# Patient Record
Sex: Male | Born: 1985 | Race: White | Hispanic: No | Marital: Single | Smoking: Current every day smoker
Health system: Southern US, Community
[De-identification: ages and names within clinical notes are randomized; demographics above are authoritative.]

## PROBLEM LIST (undated history)

## (undated) DIAGNOSIS — F419 Anxiety disorder, unspecified: Secondary | ICD-10-CM

## (undated) DIAGNOSIS — M549 Dorsalgia, unspecified: Secondary | ICD-10-CM

## (undated) DIAGNOSIS — R569 Unspecified convulsions: Secondary | ICD-10-CM

## (undated) DIAGNOSIS — M25511 Pain in right shoulder: Secondary | ICD-10-CM

## (undated) DIAGNOSIS — F329 Major depressive disorder, single episode, unspecified: Secondary | ICD-10-CM

## (undated) DIAGNOSIS — R7402 Elevation of levels of lactic acid dehydrogenase (LDH): Secondary | ICD-10-CM

## (undated) DIAGNOSIS — B1920 Unspecified viral hepatitis C without hepatic coma: Secondary | ICD-10-CM

## (undated) DIAGNOSIS — F32A Depression, unspecified: Secondary | ICD-10-CM

## (undated) DIAGNOSIS — G893 Neoplasm related pain (acute) (chronic): Secondary | ICD-10-CM

## (undated) DIAGNOSIS — R740 Nonspecific elevation of levels of transaminase and lactic acid dehydrogenase [LDH]: Principal | ICD-10-CM

## (undated) DIAGNOSIS — R7401 Elevation of levels of liver transaminase levels: Secondary | ICD-10-CM

## (undated) DIAGNOSIS — N289 Disorder of kidney and ureter, unspecified: Secondary | ICD-10-CM

## (undated) DIAGNOSIS — R519 Headache, unspecified: Secondary | ICD-10-CM

## (undated) DIAGNOSIS — R51 Headache: Secondary | ICD-10-CM

## (undated) HISTORY — PX: ROTATOR CUFF REPAIR: SHX139

## (undated) HISTORY — DX: Headache, unspecified: R51.9

## (undated) HISTORY — DX: Major depressive disorder, single episode, unspecified: F32.9

## (undated) HISTORY — DX: Headache: R51

## (undated) HISTORY — DX: Elevation of levels of liver transaminase levels: R74.01

## (undated) HISTORY — PX: WISDOM TOOTH EXTRACTION: SHX21

## (undated) HISTORY — DX: Nonspecific elevation of levels of transaminase and lactic acid dehydrogenase (ldh): R74.0

## (undated) HISTORY — DX: Depression, unspecified: F32.A

## (undated) HISTORY — DX: Elevation of levels of lactic acid dehydrogenase (LDH): R74.02

---
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
## 2003-10-19 ENCOUNTER — Ambulatory Visit (HOSPITAL_COMMUNITY): Admission: RE | Admit: 2003-10-19 | Discharge: 2003-10-19 | Payer: Self-pay | Admitting: Orthopedic Surgery

## 2003-12-27 ENCOUNTER — Emergency Department (HOSPITAL_COMMUNITY): Admission: EM | Admit: 2003-12-27 | Discharge: 2003-12-27 | Payer: Self-pay | Admitting: Emergency Medicine

## 2004-08-17 ENCOUNTER — Emergency Department (HOSPITAL_COMMUNITY): Admission: EM | Admit: 2004-08-17 | Discharge: 2004-08-17 | Payer: Self-pay | Admitting: Emergency Medicine

## 2005-10-05 ENCOUNTER — Emergency Department (HOSPITAL_COMMUNITY): Admission: EM | Admit: 2005-10-05 | Discharge: 2005-10-05 | Payer: Self-pay | Admitting: Emergency Medicine

## 2005-10-05 ENCOUNTER — Emergency Department (HOSPITAL_COMMUNITY): Admission: EM | Admit: 2005-10-05 | Discharge: 2005-10-05 | Payer: Self-pay | Admitting: *Deleted

## 2006-07-07 ENCOUNTER — Emergency Department (HOSPITAL_COMMUNITY): Admission: EM | Admit: 2006-07-07 | Discharge: 2006-07-07 | Payer: Self-pay | Admitting: Emergency Medicine

## 2006-11-20 ENCOUNTER — Emergency Department (HOSPITAL_COMMUNITY): Admission: EM | Admit: 2006-11-20 | Discharge: 2006-11-20 | Payer: Self-pay | Admitting: Emergency Medicine

## 2006-11-23 ENCOUNTER — Emergency Department (HOSPITAL_COMMUNITY): Admission: EM | Admit: 2006-11-23 | Discharge: 2006-11-23 | Payer: Self-pay | Admitting: Emergency Medicine

## 2006-12-01 ENCOUNTER — Emergency Department (HOSPITAL_COMMUNITY): Admission: EM | Admit: 2006-12-01 | Discharge: 2006-12-01 | Payer: Self-pay | Admitting: Emergency Medicine

## 2006-12-04 ENCOUNTER — Emergency Department (HOSPITAL_COMMUNITY): Admission: EM | Admit: 2006-12-04 | Discharge: 2006-12-04 | Payer: Self-pay | Admitting: Emergency Medicine

## 2007-01-29 ENCOUNTER — Emergency Department (HOSPITAL_COMMUNITY): Admission: EM | Admit: 2007-01-29 | Discharge: 2007-01-29 | Payer: Self-pay | Admitting: Internal Medicine

## 2007-05-30 ENCOUNTER — Emergency Department (HOSPITAL_COMMUNITY): Admission: EM | Admit: 2007-05-30 | Discharge: 2007-05-30 | Payer: Self-pay | Admitting: Emergency Medicine

## 2007-06-03 ENCOUNTER — Emergency Department (HOSPITAL_COMMUNITY): Admission: EM | Admit: 2007-06-03 | Discharge: 2007-06-03 | Payer: Self-pay | Admitting: Emergency Medicine

## 2007-07-16 ENCOUNTER — Emergency Department (HOSPITAL_COMMUNITY): Admission: EM | Admit: 2007-07-16 | Discharge: 2007-07-16 | Payer: Self-pay | Admitting: Emergency Medicine

## 2008-05-19 ENCOUNTER — Emergency Department (HOSPITAL_COMMUNITY): Admission: EM | Admit: 2008-05-19 | Discharge: 2008-05-19 | Payer: Self-pay | Admitting: Emergency Medicine

## 2008-06-05 ENCOUNTER — Emergency Department (HOSPITAL_COMMUNITY): Admission: EM | Admit: 2008-06-05 | Discharge: 2008-06-05 | Payer: Self-pay | Admitting: Emergency Medicine

## 2008-06-11 ENCOUNTER — Emergency Department (HOSPITAL_COMMUNITY): Admission: EM | Admit: 2008-06-11 | Discharge: 2008-06-11 | Payer: Self-pay | Admitting: Emergency Medicine

## 2008-06-13 ENCOUNTER — Emergency Department (HOSPITAL_COMMUNITY): Admission: EM | Admit: 2008-06-13 | Discharge: 2008-06-13 | Payer: Self-pay | Admitting: Emergency Medicine

## 2008-06-21 ENCOUNTER — Emergency Department (HOSPITAL_COMMUNITY): Admission: EM | Admit: 2008-06-21 | Discharge: 2008-06-21 | Payer: Self-pay | Admitting: Emergency Medicine

## 2009-01-29 DIAGNOSIS — G893 Neoplasm related pain (acute) (chronic): Secondary | ICD-10-CM

## 2009-01-29 HISTORY — DX: Neoplasm related pain (acute) (chronic): G89.3

## 2009-06-08 ENCOUNTER — Emergency Department (HOSPITAL_COMMUNITY): Admission: EM | Admit: 2009-06-08 | Discharge: 2009-06-08 | Payer: Self-pay | Admitting: Emergency Medicine

## 2009-07-16 ENCOUNTER — Emergency Department (HOSPITAL_COMMUNITY): Admission: EM | Admit: 2009-07-16 | Discharge: 2009-07-16 | Payer: Self-pay | Admitting: Emergency Medicine

## 2009-09-23 ENCOUNTER — Emergency Department (HOSPITAL_COMMUNITY): Admission: EM | Admit: 2009-09-23 | Discharge: 2009-09-23 | Payer: Self-pay | Admitting: Emergency Medicine

## 2009-10-06 ENCOUNTER — Emergency Department (HOSPITAL_COMMUNITY): Admission: EM | Admit: 2009-10-06 | Discharge: 2009-10-06 | Payer: Self-pay | Admitting: Emergency Medicine

## 2009-11-09 ENCOUNTER — Emergency Department (HOSPITAL_COMMUNITY): Admission: EM | Admit: 2009-11-09 | Discharge: 2009-11-09 | Payer: Self-pay | Admitting: Emergency Medicine

## 2009-11-17 ENCOUNTER — Emergency Department (HOSPITAL_COMMUNITY): Admission: EM | Admit: 2009-11-17 | Discharge: 2009-11-18 | Payer: Self-pay | Admitting: Emergency Medicine

## 2009-12-14 ENCOUNTER — Emergency Department (HOSPITAL_COMMUNITY): Admission: EM | Admit: 2009-12-14 | Discharge: 2009-12-14 | Payer: Self-pay | Admitting: Emergency Medicine

## 2009-12-18 ENCOUNTER — Emergency Department (HOSPITAL_COMMUNITY): Admission: EM | Admit: 2009-12-18 | Discharge: 2009-12-19 | Payer: Self-pay | Admitting: Emergency Medicine

## 2010-01-10 ENCOUNTER — Emergency Department (HOSPITAL_COMMUNITY)
Admission: EM | Admit: 2010-01-10 | Discharge: 2010-01-10 | Payer: Self-pay | Source: Home / Self Care | Admitting: Emergency Medicine

## 2010-02-19 ENCOUNTER — Encounter: Payer: Self-pay | Admitting: Orthopaedic Surgery

## 2010-02-22 LAB — URINALYSIS, ROUTINE W REFLEX MICROSCOPIC
Bilirubin Urine: NEGATIVE
Nitrite: NEGATIVE
Protein, ur: NEGATIVE mg/dL
Specific Gravity, Urine: 1.022 (ref 1.005–1.030)
Urobilinogen, UA: 0.2 mg/dL (ref 0.0–1.0)

## 2010-02-22 LAB — COMPREHENSIVE METABOLIC PANEL
ALT: 146 U/L — ABNORMAL HIGH (ref 0–53)
Calcium: 9.5 mg/dL (ref 8.4–10.5)
Creatinine, Ser: 1.1 mg/dL (ref 0.4–1.5)
GFR calc Af Amer: >60 mL/min (ref >60)
Glucose, Bld: 64 mg/dL — ABNORMAL LOW (ref 70–99)
Sodium: 139 mEq/L (ref 135–145)
Total Protein: 7.1 g/dL (ref 6.0–8.3)

## 2010-02-22 LAB — CBC
HCT: 44 % (ref 39.0–52.0)
Hemoglobin: 14.6 g/dL (ref 13.0–17.0)
WBC: 8.6 10*3/uL (ref 4.0–10.5)

## 2010-02-22 LAB — SURGICAL PCR SCREEN
MRSA, PCR: NEGATIVE
Staphylococcus aureus: NEGATIVE

## 2010-02-22 LAB — APTT: aPTT: 36 seconds (ref 24–37)

## 2010-02-22 LAB — PROTIME-INR: INR: 0.97 (ref 0.00–1.49)

## 2010-02-23 ENCOUNTER — Ambulatory Visit (HOSPITAL_COMMUNITY)
Admission: RE | Admit: 2010-02-23 | Discharge: 2010-02-23 | Payer: Self-pay | Source: Home / Self Care | Attending: Orthopedic Surgery | Admitting: Orthopedic Surgery

## 2010-02-23 LAB — HEPATIC FUNCTION PANEL
AST: 73 U/L — ABNORMAL HIGH (ref 0–37)
Albumin: 4.1 g/dL (ref 3.5–5.2)
Total Bilirubin: 1.2 mg/dL (ref 0.3–1.2)
Total Protein: 6.9 g/dL (ref 6.0–8.3)

## 2010-03-13 NOTE — Op Note (Signed)
NAME:  Lawrence [name], Lawrence [name]            ACCOUNT NO.:  [id number]  MEDICAL RECORD NO.:  [id number]          PATIENT TYPE:  AMB  LOCATION:  SDS                          FACILITY:  [facility]  PHYSICIAN:  Vania Rea. Babette Stum, M.D.  DATE OF BIRTH:  [birth year]  DATE OF PROCEDURE:  02/23/2010 DATE OF DISCHARGE:  02/23/2010                              OPERATIVE REPORT   PREOPERATIVE DIAGNOSIS:  Recurrent right shoulder multidirectional instability.  POSTOPERATIVE DIAGNOSIS:  Recurrent right shoulder multidirectional instability.  PROCEDURES: 1. Right shoulder examination under anesthesia. 2. Right shoulder glenohumeral joint diagnostic arthroscopy. 3. Anterior capsulolabral reconstruction (Bankart repair). 4. Right shoulder posterior capsulolabral reconstruction (reverse     Bankart repair).  SURGEON:  Vania Rea. Neya Creegan, MD  ASSISTANT:  Lucita Lora. Shuford, PA-C  ANESTHESIA:  General endotracheal as well as an interscalene block.  ESTIMATED BLOOD LOSS:  Minimal.  DRAINS:  None.  HISTORY:  Lawrence [name] is a 25 year old male who has developed recurrent right shoulder multidirectional instability after two previous surgeries.  He had an initial open procedure over 10 years ago and I had performed a stabilization arthroscopically back into 2005.  He did very well until the last year or so which time he has developed recurrence of shoulder instability, which now was significantly impacted in his quality of life and functional capabilities.  Preoperatively, we counseled Lawrence [name] on treatment options as well as risks versus benefits thereof.  Possible surgical complications were reviewed including the potential for bleeding, infection, neurovascular injury, persistent pain, loss of motion, recurrence of instability and possible need for additional surgery.  He understands and accepts and agrees with our planned procedure.  PROCEDURE IN DETAIL:  After undergoing routine preop evaluation, the patient  received prophylactic antibiotics and an interscalene block was established in the holding area by the Anesthesia Department.  Placed supine on the operating table, underwent smooth induction of general endotracheal anesthesia.  Turned to the left lateral decubitus position on a beanbag and appropriately padded and protected.  Right shoulder examination under anesthesia did reveal obvious multidirectional instability with a very primarily anterior component.  The right arm was then suspended at 70 degrees of abduction with 10 pounds of traction, right shoulder girdle region was sterilely prepped and draped in standard fashion.  A time-out was called.  The posterior portal was established in the glenohumeral joint and the anterior portal was established under direct visualization.  The glenohumeral articular surfaces were overall in excellent condition.  There was obvious capsular laxity with evidence for multidirectional instability.  There was some chondromalacia on the posterior aspect of the humeral head where the humeral head was obviously subluxed anteriorly.  The biceps anchor was stable.  Rotator cuff was carefully inspected and found to be intact.  At this point, we proceed with the reconstruction starting anteriorly.  A periosteal elevator was used to divide the anterior labrum and capsular attachments from 2 o'clock down to 6 o'clock and we did remove some previously placed suture material.  This was appropriately male mobilized and then we used a bur to abrade the anterior face of the glenoid from 3 o'clock down to 6 o'clock.  An  accessory anterior-inferior portal was established through the subscapularis.  I placed a series of three Arthrex 2.4 PEEK SutureTak suture anchors at the 5:30 for 2:30 positions.  These suture limbs were then shuttled through the adjacent aspect of the anterior band of the inferior glenohumeral ligament adjacent to the labral tissue allowing  a superior-ward shift as well as a plication of the capsular tissues. These were passed in a horizontal mattress pattern and then tied with sliding locking knot followed by multiple overhand throws and alternating posts.  This allowed excellent reduction of the redundant anterior capsular volume and reestablished into appropriate anterior capsulolabral "bump".  Once this was completed to our satisfaction, we then turned our attention posteriorly where once again we mobilized the capsule attachments from the 10 o'clock down to the 6 o'clock position and abraded the margin of the glenoid with barbed mobilized tissues appropriately, had to remove some retained suture material, but was ultimately had a sleep tissue that could be shift to superior-ward appropriately.  We established an accessory posterior-inferior portal. We placed two posterior suture anchors, the most inferior was 3.0 that had two suture limbs through it and were superior at the 9 o'clock position was the single limb to 2.4 anchor.  The suture limbs were then shuttled once again through the posterior-inferior capsular tissues and the posterior band of the antrum glenohumeral ligament allowing a closure of the redundant tissues, capsular plication and reconstruction of a sturdy capsulolabral "bumper" posteriorly.  These again were all passed in a horizontal mattress pattern and then tied with sliding locking knots followed by multiple overhand throws and alternating posts.  This allowed excellent closure of the redundant posterior capsular volume.  We actually incorporated the portals within the final suture inferiorly and superiorly allowing a complete posterior capsular closure.  Final inspection showed overall position, alignment and to be much to our satisfaction.  Suture limbs were all appropriately clipped. Fluid and instruments were removed.  Portals were closed with Monocryl and Steri-Strips.  The bulky dry dressing  was then taped about the right shoulder.  The right arm was placed in sling immobilizer.  The patient was then awakened, extubated, and taken to the recovery room in stable condition.     Vania Rea. Emmitt Matthews, M.D.     KMS/MEDQ  D:  02/23/2010  T:  02/24/2010  Job:  [id number]  Electronically Signed by Francena Hanly M.D. on 03/13/2010 12:04:28 PM

## 2010-04-11 LAB — DIFFERENTIAL
Basophils Absolute: 0 10*3/uL (ref 0.0–0.1)
Basophils Relative: 0 % (ref 0–1)
Eosinophils Absolute: 0.2 10*3/uL (ref 0.0–0.7)
Monocytes Absolute: 0.8 10*3/uL (ref 0.1–1.0)
Monocytes Relative: 7 % (ref 3–12)
Neutro Abs: 6.5 10*3/uL (ref 1.7–7.7)
Neutrophils Relative %: 61 % (ref 43–77)

## 2010-04-11 LAB — URINALYSIS, ROUTINE W REFLEX MICROSCOPIC
Glucose, UA: NEGATIVE mg/dL
Hgb urine dipstick: NEGATIVE
Specific Gravity, Urine: 1.033 — ABNORMAL HIGH (ref 1.005–1.030)
pH: 6.5 (ref 5.0–8.0)

## 2010-04-11 LAB — RAPID URINE DRUG SCREEN, HOSP PERFORMED
Amphetamines: NOT DETECTED
Barbiturates: NOT DETECTED
Benzodiazepines: POSITIVE — AB
Cocaine: POSITIVE — AB
Opiates: POSITIVE — AB

## 2010-04-11 LAB — BASIC METABOLIC PANEL
BUN: 17 mg/dL (ref 6–23)
CO2: 22 mEq/L (ref 19–32)
Calcium: 9.6 mg/dL (ref 8.4–10.5)
GFR calc non Af Amer: >60 mL/min (ref >60)
Glucose, Bld: 84 mg/dL (ref 70–99)

## 2010-04-11 LAB — CBC
Hemoglobin: 15.6 g/dL (ref 13.0–17.0)
MCH: 32.5 pg (ref 26.0–34.0)
MCHC: 34.5 g/dL (ref 30.0–36.0)
RDW: 14.4 % (ref 11.5–15.5)

## 2010-04-12 LAB — POCT I-STAT, CHEM 8
Creatinine, Ser: 0.8 mg/dL (ref 0.4–1.5)
Glucose, Bld: 147 mg/dL — ABNORMAL HIGH (ref 70–99)
HCT: 44 % (ref 39.0–52.0)
Hemoglobin: 15 g/dL (ref 13.0–17.0)
Potassium: 3.2 mEq/L — ABNORMAL LOW (ref 3.5–5.1)
Sodium: 140 mEq/L (ref 135–145)
TCO2: 22 mmol/L (ref 0–100)

## 2010-04-14 LAB — HEPATIC FUNCTION PANEL
AST: 92 U/L — AB (ref 14–40)
Alkaline Phosphatase: 67 U/L (ref 25–125)
Bilirubin, Total: 0.9 mg/dL

## 2010-04-16 LAB — URINALYSIS, ROUTINE W REFLEX MICROSCOPIC
Bilirubin Urine: NEGATIVE
Hgb urine dipstick: NEGATIVE
Nitrite: NEGATIVE
Protein, ur: NEGATIVE mg/dL
Urobilinogen, UA: 0.2 mg/dL (ref 0.0–1.0)

## 2010-04-16 LAB — CBC
HCT: 46.7 % (ref 39.0–52.0)
Hemoglobin: 15.7 g/dL (ref 13.0–17.0)
MCV: 93.3 fL (ref 78.0–100.0)
Platelets: 211 10*3/uL (ref 150–400)
RBC: 5 MIL/uL (ref 4.22–5.81)
WBC: 6.1 10*3/uL (ref 4.0–10.5)

## 2010-04-16 LAB — URINE CULTURE

## 2010-04-16 LAB — DIFFERENTIAL
Eosinophils Absolute: 0.2 10*3/uL (ref 0.0–0.7)
Eosinophils Relative: 3 % (ref 0–5)
Lymphocytes Relative: 32 % (ref 12–46)
Lymphs Abs: 1.9 10*3/uL (ref 0.7–4.0)
Monocytes Absolute: 0.7 10*3/uL (ref 0.1–1.0)
Monocytes Relative: 11 % (ref 3–12)

## 2010-04-16 LAB — POCT I-STAT, CHEM 8
BUN: 21 mg/dL (ref 6–23)
Creatinine, Ser: 0.9 mg/dL (ref 0.4–1.5)
Glucose, Bld: 107 mg/dL — ABNORMAL HIGH (ref 70–99)
Sodium: 140 mEq/L (ref 135–145)
TCO2: 25 mmol/L (ref 0–100)

## 2010-04-18 LAB — URINALYSIS, ROUTINE W REFLEX MICROSCOPIC
Bilirubin Urine: NEGATIVE
Glucose, UA: NEGATIVE mg/dL
Hgb urine dipstick: NEGATIVE
Protein, ur: NEGATIVE mg/dL

## 2010-05-01 LAB — ALT
ALT: 192 U/L — AB (ref 10–40)
Blood: 192

## 2010-05-01 LAB — PROTEIN, TOTAL: Blood: 6.9

## 2010-05-07 ENCOUNTER — Emergency Department (HOSPITAL_COMMUNITY)
Admission: EM | Admit: 2010-05-07 | Discharge: 2010-05-07 | Disposition: A | Discharge status: Home / Self Care | Payer: Self-pay | Attending: Emergency Medicine | Admitting: Emergency Medicine

## 2010-05-07 ENCOUNTER — Emergency Department (HOSPITAL_COMMUNITY): Payer: Self-pay

## 2010-05-07 DIAGNOSIS — X58XXXA Exposure to other specified factors, initial encounter: Secondary | ICD-10-CM | POA: Insufficient documentation

## 2010-05-07 DIAGNOSIS — Z9889 Other specified postprocedural states: Secondary | ICD-10-CM | POA: Insufficient documentation

## 2010-05-07 DIAGNOSIS — IMO0002 Reserved for concepts with insufficient information to code with codable children: Secondary | ICD-10-CM | POA: Insufficient documentation

## 2010-05-07 DIAGNOSIS — Y9289 Other specified places as the place of occurrence of the external cause: Secondary | ICD-10-CM | POA: Insufficient documentation

## 2010-05-07 DIAGNOSIS — M25519 Pain in unspecified shoulder: Secondary | ICD-10-CM | POA: Insufficient documentation

## 2010-05-09 LAB — STREP A DNA PROBE

## 2010-05-09 LAB — RAPID STREP SCREEN (MED CTR MEBANE ONLY): Streptococcus, Group A Screen (Direct): NEGATIVE

## 2010-05-12 ENCOUNTER — Ambulatory Visit (INDEPENDENT_AMBULATORY_CARE_PROVIDER_SITE_OTHER): Payer: Self-pay | Admitting: Internal Medicine

## 2010-05-12 ENCOUNTER — Encounter: Payer: Self-pay | Admitting: Internal Medicine

## 2010-05-12 VITALS — BP 130/80 | HR 84 | Ht 69.0 in | Wt 212.0 lb

## 2010-05-12 DIAGNOSIS — R109 Unspecified abdominal pain: Secondary | ICD-10-CM

## 2010-05-12 NOTE — Progress Notes (Signed)
Subjective:    Patient ID: Lawrence [name], male    DOB: Nov 18, 1985, 25 y.o.   MRN: [id number]  HPI Lawrence [name] is here to followup elevated hepatic enzymes. Preoperatively in January of this year he was found to have elevated hepatic enzymes.  He was having a "tightening" procedure on the right shoulder by Dr. Rennis Chris . He had rotator cuff surgery for the first time in 2001. Followup arthroscopic surgery right shoulder in 2004.   He has no past medical history of hepatitis A,B or C. Remotely he did use IV drugs. He drinks occasionally. He has had Hepatitis immunization @ school 2002.  He denies fever, chills, sweats, or weight loss.   Plan results were reviewed. His ALT has ranged from 146-192; AST was initially 78 but rose to 92.        Review of Systems He has had intermittent abdominal pain.   Location #1 : L mid abd &  # 2occasionally RLQ Onset: in January post op  Radiation: no; Dysphagia : occasionally, esp bread Severity: up to 2 on 10 scale  Quality: #1dull , deep pain & #2 dull Duration: 5-10 min Better with: eating, TUMS Worse with: pain meds ( Oxycodone)   Symptoms Nausea/Vomiting: no  Diarrhea: no  Constipation: yes, with narcotic  Melena/BRBPR: no  Hematemesis: no  Anorexia: yes, intermittently  Fever/Chills: no  Dysuria/ pyuria/hematuria: no  Rash: no  Wt loss: no  EtOH use: yes, 2 beers every every 2-4  Weeks or less  NSAIDs/ASA: yes, Naproxen  1 pill bid   STD risk/hx: no (tested 09/2009)  Past Surgeries: see above. No PMH of transfusions        Objective:   Physical Exam Gen.: Healthy and well-nourished in appearance. Alert, appropriate and cooperative throughout exam. Very open & forthright Head: Normocephalic without obvious abnormalities;no  Alopecia. Thin beard Eyes: No corneal or conjunctival inflammation noted. Pupils equal round reactive to light and accommodation. No icterus  Mouth: Oral mucosa and oropharynx reveal no lesions or exudates but  tonsils large  & oropharyngeal erythema. Teeth in good repair. Neck: No deformities, masses, or tenderness noted. Range of motion normal. Thyroid  W/o nodules or enlargement. Lungs: Normal respiratory effort; chest expands symmetrically. Lungs are clear to auscultation without rales, wheezes, or increased work of breathing. Heart: Normal rate and rhythm. Normal S1 and S2. No gallop, click, or rub.S4; no murmur. Abdomen: Bowel sounds normal; abdomen soft and nontender. No masses, organomegaly or hernias noted.  Musculoskeletal/extremities:  No clubbing, cyanosis, edema, or deformity noted. Range of motion   decreased @ R shoulder .Tone & strength  normal.Joints normal. Nail health  good. Vascular: Carotid, radial artery, dorsalis pedis and dorsalis posterior tibial pulses are full and equal. No bruits present. Neurologic: Alert and oriented x3. Deep tendon reflexes symmetrical and normal. Skin: Intact without suspicious lesions or rashes. No jaundice  Lymph: No cervical, axillary, or inguinal lymphadenopathy present.  Psych: Mood and affect are normal. Normally interactive    Very open  Assessment & Plan:  #1 elevated liver function test; serially these have risen  #2 abdominal pain. Actually he  describes 2 types of abdominal pain. The left-sided pain is suggested to be reflux in nature.  #3 remote past history of IV drug abuse. This is an INactive issue.  Plan: #1 acute hepatitis panel. The triggers for liver function derangement were discussed. These include excess alcohol, excess Tylenol, and excess vitamin A  #2 the triggers for reflux were also discussed. These include the aspirin family (nonsteroidals including naproxen) ,alcohol, peppermint, tobacco, and caffeine (coffee, tea, cola, and chocolate). A protein pump inhibitor daily would be recommended with avoidance of these triggers. He should not  eat within 2 hours of going to bed.

## 2010-05-12 NOTE — Patient Instructions (Signed)
Please review this note in detail. Consider the triggers which can cause your symptoms.  I would recommend a fasting liver panel after you've made these changes for approximately 6 weeks. Take the Prilosec OTC 30 minutes before breakfast.

## 2010-05-15 ENCOUNTER — Other Ambulatory Visit: Payer: Self-pay | Admitting: Internal Medicine

## 2010-05-15 DIAGNOSIS — R768 Other specified abnormal immunological findings in serum: Secondary | ICD-10-CM

## 2010-05-15 LAB — HEPATITIS PANEL, ACUTE
HCV Ab: REACTIVE — AB
Hep A IgM: NEGATIVE
Hep B C IgM: NEGATIVE

## 2010-05-26 ENCOUNTER — Telehealth: Payer: Self-pay | Admitting: Internal Medicine

## 2010-05-26 NOTE — Telephone Encounter (Signed)
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

## 2010-05-26 NOTE — Telephone Encounter (Signed)
Spoke w/ pt grandfather informed that Hop out of office and will not return to Monday. Also informed that we didn't have designated release to speak w/ them will have to recheck records again since they state it was signed.

## 2010-05-27 NOTE — Telephone Encounter (Signed)
He needed follow up quantitative  Hepatitis C  serologies ( see Appendum to report). Derm needs to know of + hepatitis C . I never received notice of calls last week

## 2010-05-29 NOTE — Telephone Encounter (Signed)
Spoke w/ pt grandmother lab suggest HCV Qualitative, PCR [id number] will have pt schedule labs this week to be done at [facility] but will give me a call to know exactly what day so orders can be added.

## 2010-05-30 ENCOUNTER — Ambulatory Visit: Payer: Self-pay | Admitting: Internal Medicine

## 2010-06-01 ENCOUNTER — Other Ambulatory Visit: Payer: Self-pay

## 2010-06-01 DIAGNOSIS — R894 Abnormal immunological findings in specimens from other organs, systems and tissues: Secondary | ICD-10-CM

## 2010-06-07 ENCOUNTER — Emergency Department (HOSPITAL_COMMUNITY)
Admission: EM | Admit: 2010-06-07 | Discharge: 2010-06-07 | Disposition: A | Discharge status: Home / Self Care | Payer: Self-pay | Attending: Emergency Medicine | Admitting: Emergency Medicine

## 2010-06-07 DIAGNOSIS — M545 Low back pain, unspecified: Secondary | ICD-10-CM | POA: Insufficient documentation

## 2010-06-07 DIAGNOSIS — M5430 Sciatica, unspecified side: Secondary | ICD-10-CM | POA: Insufficient documentation

## 2010-06-07 DIAGNOSIS — M79609 Pain in unspecified limb: Secondary | ICD-10-CM | POA: Insufficient documentation

## 2010-06-07 DIAGNOSIS — G8929 Other chronic pain: Secondary | ICD-10-CM | POA: Insufficient documentation

## 2010-06-16 NOTE — Op Note (Signed)
[place]  Patient:    Lawrence [name], Lawrence [name] Visit Number: [id number] MRN: [id number]          Service Type: DSU Location: [facility] Attending Physician:  Colbert Ewing Dictated by:   Loreta Ave, M.D. Proc. Date: 10/24/00 Admit Date:  10/24/2000                             Operative Report  PREOPERATIVE DIAGNOSIS:  Multidirectional instability, right shoulder with secondary rotator cuff tendinitis and subacromial impingement.  POSTOPERATIVE DIAGNOSIS:  Multidirectional instability, right shoulder with secondary rotator cuff tendinitis and subacromial impingement.  OPERATIVE PROCEDURE:  Right shoulder exam under anesthesia with diagnostic arthroscopy and subacromial evaluation.  Open anteroinferior capsular shift, Neer-type.  SURGEON:  Loreta Ave, M.D.  ASSISTANT:  Arlys John D. Petrarca, P.A.-C.  ANESTHESIA:  General.  ESTIMATED BLOOD LOSS:  Minimal.  SPECIMENS:  None.  CULTURES:  None.  COMPLICATIONS:  None.  DRESSING:  Self-compressive with shoulder immobilizer.  DESCRIPTION OF PROCEDURE:  The patient was brought to the operating room and placed on the operating table in the supine position.  After adequate anesthesia had been obtained, the right shoulder was examined. Multidirectional instability, most marked _______ anteroinferior.  Some degree of posterior instability as well.  Full motion otherwise.  Placed in a beach chair position, prepped and draped in the usual sterile fashion.  A posterior portal used to access the shoulder.  The shoulder inspected with intact capsular ligamentous structures, somewhat redundant, but with no Bankart lesion or Hill-Sachs lesion.  Articular cartilage, labrum, biceps tendon, rotator cuff intact.  Cannula redirected subacromially on the top of the _________.  No significant spurring at the acromial/os interface.  Some bursitis and inflammatory tissue debrided.  The cuff itself was  structurally intact.  Instruments and fluid removed.  The anterior incision and coracoid to the anterior axillary fold.  Deltopectoral interval opened and appropriate retractors put in place.  Subscapularis tendon was taken down and tagged with #2 Ethibond.  Anterior capsule was left somewhat thickened with portions of the subscapularis tendon.  There was interval tearing at the top of the subscapularis which was then repaired with Ethibond.  The capsule was then cut from the 12 oclock to 6 oclock position at the humeral attachment.  Cut from the humerus to the glenoid at the 3 oclock position.  Freed up to pick up the redundancy in the capsule.  The anterior leaflet was then brought all the way up to the 4 oclock position and sutured in place at the humeral attachment with multiple interrupted #2 Ethibond sutures.  Superior capsule was then brought down to the 6 oclock position and oversewed again with nonabsorbable suture.  The two leaflets where they overlapped were also reinforced with suture.  This brought the shoulder into a much more stable position, taking up a lot of the capsule redundancy and an effort was taken to bring as much of the posterior capsule around to the front to tie up the posterior aspect as well.  Still maintained good motion, but with marked improvement in stability. Care taken to protect the axillary nerve throughout.  The subscapularis tendon was then repaired anatomically with Ethibond.  The wound copiously irrigated. The shoulder examined with good stability and motion with marked improvement of the instability present preoperative.  I could access the subacromial space from the anterior incision with digital inspection, and again, no significant bony spurs  were palpated.  The deltopectoral interval allowed to close.  The wound closed with subcutaneous and subcuticular Vicryl.  Margins of the wound were injected with Marcaine. Posterior portal closed with  nylon and injected with Marcaine.  A sterile compressive dressing and shoulder immobilizer applied.  Anesthesia reversed and brought to the recovery room.  Tolerated surgery well and with no complications. Dictated by:   Loreta Ave, M.D. Attending Physician:  Colbert Ewing DD:  10/24/00 TD:  10/24/00 Job: [id number]

## 2010-06-22 ENCOUNTER — Other Ambulatory Visit (INDEPENDENT_AMBULATORY_CARE_PROVIDER_SITE_OTHER): Payer: Self-pay | Admitting: General Surgery

## 2010-06-22 DIAGNOSIS — M7918 Myalgia, other site: Secondary | ICD-10-CM

## 2010-06-28 ENCOUNTER — Ambulatory Visit (HOSPITAL_COMMUNITY)
Admission: RE | Admit: 2010-06-28 | Discharge: 2010-06-28 | Disposition: A | Discharge status: Home / Self Care | Payer: Self-pay | Source: Ambulatory Visit | Attending: General Surgery | Admitting: General Surgery

## 2010-06-28 ENCOUNTER — Other Ambulatory Visit (INDEPENDENT_AMBULATORY_CARE_PROVIDER_SITE_OTHER): Payer: Self-pay | Admitting: General Surgery

## 2010-06-28 DIAGNOSIS — M7918 Myalgia, other site: Secondary | ICD-10-CM

## 2010-06-28 DIAGNOSIS — M25859 Other specified joint disorders, unspecified hip: Secondary | ICD-10-CM | POA: Insufficient documentation

## 2010-06-28 DIAGNOSIS — R229 Localized swelling, mass and lump, unspecified: Secondary | ICD-10-CM | POA: Insufficient documentation

## 2010-06-28 DIAGNOSIS — M4120 Other idiopathic scoliosis, site unspecified: Secondary | ICD-10-CM | POA: Insufficient documentation

## 2010-06-29 ENCOUNTER — Telehealth: Payer: Self-pay

## 2010-06-29 NOTE — Telephone Encounter (Signed)
Spoke w/ pt grandmother says she received a call about returning for labs and wanted to know what was going to be done this time says labs are costing around $400-500 and can't keep coming in for labs. Informed grandmother that test perform wasn't correct test per Dr. Alwyn Ren. Says he went to [facility] to have test and was told that the wasn't sure if this was correct test and pt had to be stuck multiple times says if this was the case why didn't they call to check and be sure this could have prevented another bill from coming which was $500. Informed grandmother that our office doesn't have anything to do with billing since test isn't process by our lab.  Informed that he needs to return to lab for more testing which we have written down will bring son in to our office instead of [facility] for repeat labs but just wanted to know what they can do about bill from labs performed on 06/01/10.

## 2010-07-03 ENCOUNTER — Telehealth: Payer: Self-pay

## 2010-07-03 DIAGNOSIS — B1920 Unspecified viral hepatitis C without hepatic coma: Secondary | ICD-10-CM

## 2010-07-03 NOTE — Telephone Encounter (Signed)
Order placed for Hep C clinic

## 2010-07-04 ENCOUNTER — Telehealth: Payer: Self-pay | Admitting: Internal Medicine

## 2010-07-04 NOTE — Telephone Encounter (Signed)
Left msg on voicemail for pt to return call . 

## 2010-07-07 ENCOUNTER — Ambulatory Visit: Payer: Self-pay | Admitting: Physical Medicine & Rehabilitation

## 2010-07-07 NOTE — Telephone Encounter (Signed)
Left msg on voicemail for pt to return call . 

## 2010-07-14 ENCOUNTER — Ambulatory Visit: Payer: Self-pay | Admitting: Physical Medicine & Rehabilitation

## 2010-07-14 ENCOUNTER — Inpatient Hospital Stay (INDEPENDENT_AMBULATORY_CARE_PROVIDER_SITE_OTHER)
Admission: RE | Admit: 2010-07-14 | Discharge: 2010-07-14 | Disposition: A | Discharge status: Home / Self Care | Payer: Self-pay | Source: Ambulatory Visit | Attending: Emergency Medicine | Admitting: Emergency Medicine

## 2010-07-14 ENCOUNTER — Ambulatory Visit (INDEPENDENT_AMBULATORY_CARE_PROVIDER_SITE_OTHER): Payer: Self-pay

## 2010-07-14 DIAGNOSIS — IMO0002 Reserved for concepts with insufficient information to code with codable children: Secondary | ICD-10-CM

## 2010-08-05 ENCOUNTER — Emergency Department (HOSPITAL_COMMUNITY): Payer: Self-pay

## 2010-08-05 ENCOUNTER — Emergency Department (HOSPITAL_COMMUNITY)
Admission: EM | Admit: 2010-08-05 | Discharge: 2010-08-05 | Disposition: A | Discharge status: Home / Self Care | Payer: Self-pay | Attending: Emergency Medicine | Admitting: Emergency Medicine

## 2010-08-05 ENCOUNTER — Emergency Department (HOSPITAL_COMMUNITY)
Admission: EM | Admit: 2010-08-05 | Discharge: 2010-08-06 | Disposition: A | Discharge status: Home / Self Care | Payer: Self-pay | Attending: Emergency Medicine | Admitting: Emergency Medicine

## 2010-08-05 DIAGNOSIS — M549 Dorsalgia, unspecified: Secondary | ICD-10-CM | POA: Insufficient documentation

## 2010-08-05 DIAGNOSIS — F411 Generalized anxiety disorder: Secondary | ICD-10-CM | POA: Insufficient documentation

## 2010-08-05 DIAGNOSIS — M25519 Pain in unspecified shoulder: Secondary | ICD-10-CM | POA: Insufficient documentation

## 2010-08-05 DIAGNOSIS — T148XXA Other injury of unspecified body region, initial encounter: Secondary | ICD-10-CM | POA: Insufficient documentation

## 2010-08-05 DIAGNOSIS — Z87442 Personal history of urinary calculi: Secondary | ICD-10-CM | POA: Insufficient documentation

## 2010-08-05 DIAGNOSIS — W010XXA Fall on same level from slipping, tripping and stumbling without subsequent striking against object, initial encounter: Secondary | ICD-10-CM | POA: Insufficient documentation

## 2010-08-05 DIAGNOSIS — R079 Chest pain, unspecified: Secondary | ICD-10-CM | POA: Insufficient documentation

## 2010-08-05 DIAGNOSIS — X500XXA Overexertion from strenuous movement or load, initial encounter: Secondary | ICD-10-CM | POA: Insufficient documentation

## 2010-08-05 DIAGNOSIS — Y93E1 Activity, personal bathing and showering: Secondary | ICD-10-CM | POA: Insufficient documentation

## 2010-08-05 DIAGNOSIS — M546 Pain in thoracic spine: Secondary | ICD-10-CM | POA: Insufficient documentation

## 2010-08-05 DIAGNOSIS — Z79899 Other long term (current) drug therapy: Secondary | ICD-10-CM | POA: Insufficient documentation

## 2010-08-16 ENCOUNTER — Telehealth: Payer: Self-pay | Admitting: *Deleted

## 2010-08-16 ENCOUNTER — Other Ambulatory Visit: Payer: Self-pay | Admitting: Orthopaedic Surgery

## 2010-08-16 DIAGNOSIS — M545 Low back pain: Secondary | ICD-10-CM

## 2010-08-16 DIAGNOSIS — M79605 Pain in left leg: Secondary | ICD-10-CM

## 2010-08-16 NOTE — Telephone Encounter (Signed)
Pt family member called noting that pt was referred to Hep C clinic. She states that this was over one month ago and they have not heard anything. She notes that she has called several times and has received a return call. Will forward message to [facility] for assistance.

## 2010-08-18 NOTE — Telephone Encounter (Signed)
Noted. Thanks.

## 2010-08-18 NOTE — Telephone Encounter (Addendum)
I called the Hep Clinic on 08-17-10 to check status of referral, had to LVM for someone to call me back.  I did not receive a call back yet, so I have now called & left a 2nd message for status of referral.  I have informed Lawrence [name] of above, and will contact her as soon as I get return call from Clinic.

## 2010-08-21 NOTE — Telephone Encounter (Signed)
Per return call from [facility], they are very behind.  They do still have patient's referral, however it looks as though when they contact him to schedule it may be a September appt.  They have 1 doctor that comes from [facility], and sees patient's only once per week on Thursdays.  I informed Alphonzo Severance, she understands.

## 2010-08-26 ENCOUNTER — Other Ambulatory Visit: Payer: Self-pay

## 2010-08-28 ENCOUNTER — Ambulatory Visit: Payer: Self-pay | Admitting: Physical Medicine & Rehabilitation

## 2010-08-28 ENCOUNTER — Encounter: Payer: Self-pay | Attending: Physical Medicine & Rehabilitation

## 2010-08-28 ENCOUNTER — Ambulatory Visit (HOSPITAL_COMMUNITY)
Admission: RE | Admit: 2010-08-28 | Discharge: 2010-08-28 | Disposition: A | Discharge status: Home / Self Care | Payer: Self-pay | Source: Ambulatory Visit | Attending: Orthopaedic Surgery | Admitting: Orthopaedic Surgery

## 2010-08-28 DIAGNOSIS — M79609 Pain in unspecified limb: Secondary | ICD-10-CM | POA: Insufficient documentation

## 2010-08-28 DIAGNOSIS — M545 Low back pain, unspecified: Secondary | ICD-10-CM | POA: Insufficient documentation

## 2010-08-28 DIAGNOSIS — M519 Unspecified thoracic, thoracolumbar and lumbosacral intervertebral disc disorder: Secondary | ICD-10-CM | POA: Insufficient documentation

## 2010-08-28 DIAGNOSIS — M47817 Spondylosis without myelopathy or radiculopathy, lumbosacral region: Secondary | ICD-10-CM | POA: Insufficient documentation

## 2010-08-28 DIAGNOSIS — M79605 Pain in left leg: Secondary | ICD-10-CM

## 2010-08-28 DIAGNOSIS — M5430 Sciatica, unspecified side: Secondary | ICD-10-CM | POA: Insufficient documentation

## 2010-09-04 ENCOUNTER — Other Ambulatory Visit: Payer: Self-pay | Admitting: Orthopaedic Surgery

## 2010-09-04 DIAGNOSIS — M47816 Spondylosis without myelopathy or radiculopathy, lumbar region: Secondary | ICD-10-CM

## 2010-09-11 ENCOUNTER — Ambulatory Visit
Admission: RE | Admit: 2010-09-11 | Discharge: 2010-09-11 | Disposition: A | Discharge status: Home / Self Care | Payer: No Typology Code available for payment source | Source: Ambulatory Visit | Attending: Orthopaedic Surgery | Admitting: Orthopaedic Surgery

## 2010-09-11 DIAGNOSIS — M47816 Spondylosis without myelopathy or radiculopathy, lumbar region: Secondary | ICD-10-CM

## 2010-09-11 MED ORDER — METHYLPREDNISOLONE ACETATE 40 MG/ML INJ SUSP (RADIOLOG
120.0000 mg | Freq: Once | INTRAMUSCULAR | Status: AC
  Administered 2010-09-11: 120 mg via EPIDURAL

## 2010-09-11 MED ORDER — IOHEXOL 180 MG/ML  SOLN
1.0000 mL | Freq: Once | INTRAMUSCULAR | Status: AC | PRN
  Administered 2010-09-11: 1 mL via EPIDURAL

## 2010-09-28 ENCOUNTER — Ambulatory Visit (INDEPENDENT_AMBULATORY_CARE_PROVIDER_SITE_OTHER): Payer: No Typology Code available for payment source | Admitting: Gastroenterology

## 2010-09-28 VITALS — BP 147/99 | HR 86 | Temp 99.2°F | Ht 70.0 in | Wt 206.0 lb

## 2010-09-28 DIAGNOSIS — B182 Chronic viral hepatitis C: Secondary | ICD-10-CM

## 2010-09-29 LAB — CBC WITH DIFFERENTIAL/PLATELET
Lymphocytes Relative: 25 % (ref 12–46)
Lymphs Abs: 2.7 10*3/uL (ref 0.7–4.0)
MCV: 90.9 fL (ref 78.0–100.0)
Neutrophils Relative %: 66 % (ref 43–77)
Platelets: 364 10*3/uL (ref 150–400)
RBC: 4.94 MIL/uL (ref 4.22–5.81)
WBC: 11.1 10*3/uL — ABNORMAL HIGH (ref 4.0–10.5)

## 2010-09-29 LAB — COMPREHENSIVE METABOLIC PANEL
AST: 34 U/L (ref 0–37)
Albumin: 5 g/dL (ref 3.5–5.2)
Alkaline Phosphatase: 70 U/L (ref 39–117)
Potassium: 4 mEq/L (ref 3.5–5.3)
Sodium: 138 mEq/L (ref 135–145)
Total Protein: 7.9 g/dL (ref 6.0–8.3)

## 2010-09-29 LAB — TSH: TSH: 0.583 u[IU]/mL (ref 0.350–4.500)

## 2010-09-29 LAB — IRON AND TIBC
TIBC: 425 ug/dL (ref 215–435)
UIBC: 289 ug/dL (ref 125–400)

## 2010-09-29 LAB — AFP TUMOR MARKER: AFP-Tumor Marker: 3.4 ng/mL (ref 0.0–8.0)

## 2010-09-29 LAB — PROTIME-INR: INR: 0.97 (ref <1.50)

## 2010-09-29 LAB — ANA: Anti Nuclear Antibody(ANA): NEGATIVE

## 2010-10-12 ENCOUNTER — Ambulatory Visit (INDEPENDENT_AMBULATORY_CARE_PROVIDER_SITE_OTHER): Payer: No Typology Code available for payment source | Admitting: Gastroenterology

## 2010-10-12 DIAGNOSIS — B182 Chronic viral hepatitis C: Secondary | ICD-10-CM

## 2010-10-12 NOTE — Progress Notes (Signed)
NAME:  Lawrence [name], [name]  MR#:  [id number]      DATE:  09/28/2010  DOB:  May 18, 1985    cc: Consulting Physician: Lawrence [name] at [facility], [address] [place], [place] [address], Primary Care Physician:  Same. Referring Physician:  Marga Melnick, MD, [facility] [place], [address] [place], [place], [place] [address], Fax [phone]    REASON FOR REFERRAL:  Positive hepatitis C antibody and qualitative HCV RNA.   History:  The patient is a 25 year old man who I have been asked to see in consultation by Dr. Alwyn [name] regarding his positive hepatitis C antibody and positive HCV RNA qualitative HCV RNA.  According to the patient, there is no known history of liver disease until he presented to Lawrence [name], a local orthopedic surgeon in [place], for repair of a right rotator cuff injury. Liver tests  were done at that time were found to be abnormal. Eventually this was sent back to his primary physician who on 05/15/2010, found that he was hepatitis C antibody positive. This was confirmed with a positive  qualitative HCV RNA. Unfortunately, this was not quantitated nor was a genotype done. There are no symptoms referable to his history of hepatitis C nor are there symptoms to suggest cryoglobulin mediated or  decompensated liver disease.  With respect to risk factors for liver disease, he would binge drink approximately once a month, but he has not alcohol in the last 5-6 months. 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 He has not had any counselor relationship at this time. There is no history of blood transfusions, tattoos, or unsterile body piercing,  or family history of liver disease. He thinks he may have been vaccinated hepatitis B in the past.    Past medical history:  Unremarkable, specifically there is no thyroid disease, diabetes, dyslipidemia, hypertension, or coronary artery disease.   PAST SURGICAL HISTORY:  Three  repairs to his right rotator cuff last 1 being January 2012.    Past psychiatric history:  Anxiety disorder for last 9 years. There is no history of suicide attempt or hospitalizations for the same. He primarily has panic attacks when the anxiety level builds up. He sees Customer service manager a  local therapist, the office for which I cannot find, every 2 months, and he also sees another therapist in [place] every 2 weeks, the name of which she cannot recall.   CURRENT MEDICATIONS:  Xanax 1 mg p.o. t.i.d., Percocet for back pain q. 6 hours p.r.n.   ALLERGIES:  Tramadol causes fever with headaches.    Habits:  Smoking history, smokes 1 pack of cigarettes per day. Alcohol as above.   FAMILY HISTORY:  As above.   SOCIAL HISTORY:  Single with 1 child. Works at [employer]. He is accompanied by his grandmother today.   REVIEW OF SYSTEMS:  All 10 systems reviewed today with the patient on the review of systems form, which was signed and placed in the chart. CES-D was 30.     PHYSICAL EXAMINATION:  Constitutional:  No significant peripheral wasting. Vital signs: Height 70 inches, weight 206 pounds, blood pressure 147/99, pulse of 86, temperature 99.2 Fahrenheit. Ears, nose, mouth and throat:   Unremarkable oropharynx.  No thyromegaly or neck masses.  Chest:  Resonant to percussion.  Clear to auscultation.  Cardiovascular:  Heart sounds normal S1, S2 without murmurs or rubs.  There is no  peripheral edema.  Abdominal:  Normal bowel sounds.  No masses or tenderness.  I could not appreciate a liver edge or spleen tip.  I could not appreciate any hernias.  Lymphatics:  No cervical or  inguinal lymphadenopathy.  Central Nervous System:  No asterixis or focal neurologic findings.  Dermatologic:  Anicteric without palmar  erythema or spider angiomata.  Eyes:  Anicteric sclerae.  Pupils are equal and reactive to light.   laboratories:  On 05/12/2010, hepatitis C antibody was positive, hepatitis B  surface antibody was negative. ALT on 05/01/2010 was 192, AST on 04/14/2010, was 92, with an ALP of 67, total bilirubin 0.9 of which the direct was 0.2.   Assessment:  The patient is a 25 year old gentleman with history of genotype unknown hepatitis C who is naive to therapy and has not been biopsied. I have concerns about his candidacy for treatment because of his  history of anxiety disorder. He does not even know the name of his therapist that he started seeing so that I can stay in contact with this person to establish his fitness to tolerate therapy. There is  most likely no rush to treat him at this time, however.  In my discussion today with the patient and his grandmother who brought him today, we discussed the nature and natural history of hepatitis C. We discussed genotyping him. I discussed treatment of all  genotypes with pegylated interferon, ribavirin and genotype ones with protease inhibitors in addition to the interferon and ribavirin. I reviewed the specific systems, constitutional, psychiatric side  effects of therapy and the response rates. I discussed our protocol for clinic. We also discussed the risk of contagion. I have asked him  that when he sees his therapist next to discuss the possibility of treatment and its indications with them.   Plan:  1. Standard labs. 2. Test for hepatitis A and B immunity. 3. To arrange for a biopsy if genotype 1, to assess extent of disease before deciding on treatment and then follow up. 4. If nongenotype 1, then will not need a biopsy. Will come back to clinic discuss results directly. 5. Literature on hepatitis C given to the patient and his grandmother. 6. I told him he must stop smoking. 7. He is not a candidate for any protocols because of his anxiety disorder. 8. He is to follow up in 2 month's time. 9. He and his grandmother report that they would supply the name of the therapist that he sees in [place], so I can copy my notes to  this person.            Brooke Dare, MD   ADDENDUM Genotype 2b  After the visit on 10/09/10, the patient called back with the name of the therapist:   Tamera Punt at the  [facility],   [facility] [place] [facility] [address]  [phone] [place], [place] [address] Fax: [phone] Her opinion is critical in the determination of his candidacy for treatment. I can be reached by her at [phone] to discuss.  [phone] .[id number]  D:  Thu Aug 30 17:50:54 2012 ; T:  Thu Aug 30 21:03:12 2012  Job #:  [id number]

## 2010-10-19 ENCOUNTER — Encounter: Payer: Self-pay | Admitting: Gastroenterology

## 2010-10-19 NOTE — Progress Notes (Signed)
NAMEMarland [name]  [name], Lawrence    MR#:  [id number]      DATE:  10/12/2010  DOB:  1985/07/05    cc:     Referring physician:  Marga Melnick, MD [facility] [place] [facility] [address] [place], [place]   [address] Fax:  [phone]   Primary care physician:  The patient and his grandmother come in today reporting that he has no family physician.    Consulting physician:  Tamera Punt [facility]  [place] [facility] [address]  [place], [place] [address]  Fax: [phone]  Phone:  [phone]   REASON FOR VISIT:  Follow up of genotype 2B hepatitis C.   HISTORY:  The patient returns today accompanied by his grandmother. He returns to review the results of the lab testing from his visit on 09/28/2010. When I first saw him I was concerned about his anxiety disorder and  its impact on his fitness to tolerate therapy. At that time he could not even tell me the name of his therapist. His family had called in the name of the therapist, Tamera Punt, on 10/09/2010. Because I only  see patients 1 day a week, this is the first time I have seen the chart since their call and I have not had a chance to talk to Tamera Punt before the appointment.   PAST MEDICAL HISTORY:  There are no interval changes.   CURRENT MEDICATIONS:  1. Xanax 1 mg p.o. t.i.d. 2. Percocet for back pain q.6 hours p.r.n.  ALLERGIES:  Tylenol causes fever and headache.    HABITS:  Smoking:  1 pack of cigarettes per day. Alcohol: Denies interval consumption.   REVIEW OF SYSTEMS:  All 10 systems reviewed today with the patient and are negative other than that which is mentioned above. His CES-D 26.   PHYSICAL EXAMINATION:  Constitutional: Well appearing. Vital signs: Height 70 inches, weight 211 pounds, blood pressure 122/88, pulse 94, temperature 97.6.   LABORATORY STUDIES:  Previous laboratory work from 09/28/2010.  Genotype 2B.  His iron saturation  was 32%.  CBC was unremarkable with a platelet count of 364. AFP was 3.4.  ANA negative.  His AST was 34, ALT 63, ALP 70,  total bilirubin was 1.6, creatinine was 0.7, albumin was 5 with globulins of 2.9.  Ferritin was 55.  Total A antibody was negative.  Hepatitis B surface antigen was positive. INR was 0.97 and TSH was normal at 0.583.   ASSESSMENT:  The patient is a 25 year old gentleman with a history of genotype 2B hepatitis C, naive to therapy. He appears to be clinically well compensated. I think the bilirubin is possibly a laboratory error,  because his previous bilirubins have been much better and his synthetic function otherwise appears acceptable.  My continued concern is that of the impact of interferon on his anxiety disorder. I would like to speak to his therapist regarding this.    In my discussion today with the patient and his grandmother, who accompanied him, we discussed his lab results from his last clinic appointment. I have explained to him that for his genotype 2B he would  need 6 months of therapy with an 80% response with PEG-interferon and ribavirin. I once again reviewed the specific system, constitutional, and psychiatric side effects of therapy, with an emphasis on  psychiatric side effects because of his history of anxiety. He indicated to me that I could talk to his therapist regarding this. I have  explained that if I was to receive favorable reviews of his compliance and progress through therapy from his therapist, I could consider sending him the patient application  forms for assistance for the Pegasys and ribavirin, as he lacks insurance. The patient indicates that he is going to see his therapist  tomorrow for a regularly scheduled appointment. I asked him to discuss the issue of treatment with her at the time as well.   plan:  1. I will contact this therapist by phone today after his clinic appointment. 2. He will need to obtain hepatitis A vaccination.  In  keeping with the [facility] health policy, unfortunately I cannot do that here.  His grandmother indicated that they would try to contact Dr. Quintella Reichert to see if they could pay for the vaccine out of pocket. 3. He is hepatitis B immune.            Brooke Dare, MD    Addendum:   I have call [facility]. I attempted to leave a message for Tamera Punt to call me back. The receptionist would not accept my explanation that the patient has already given me  permission to speak to her and that the patient will see Ms Tyre tomorrow to discuss this as well. Nevertheless, I left a message and have not heard back from her as of yet. The onus is on both the  patient and Ms Michaelene Song to contact me before I can proceed with treatment for the patient.  Having not heard back within a week, I wrote a letter to the patient indicating that I have not heard back from his therapist after leaving a message and he needs to have the therapist contact me in order to move forward.     [phone] .[id number]  D:  Thu Sep 13 20:26:40 2012 ; T:  Sat Sep 15 14:07:13 2012  Job #:  [id number]

## 2010-10-20 ENCOUNTER — Telehealth: Payer: Self-pay | Admitting: Gastroenterology

## 2010-10-20 NOTE — Telephone Encounter (Signed)
Tamera Punt, Mr [facility] therapist, called and we discussed his candidacy for HCV therapy.  He is compliant with visits and is seen every 10 days.  She thinks his anxiety is controlled if he is maintained on his Xanax.  I told her that I would consider starting him on treatment for his hepatitis C and that she should call me on my work cell should there be any changes.  She will see him today.  I will need to send him the [facility] Access to Care application.

## 2010-10-26 ENCOUNTER — Encounter: Payer: Self-pay | Admitting: Gastroenterology

## 2010-11-03 LAB — CBC
HCT: 42.5
MCHC: 34.2
Platelets: 388
RDW: 14.7

## 2010-11-03 LAB — DIFFERENTIAL
Basophils Absolute: 0.1
Basophils Relative: 1
Eosinophils Absolute: 0.2
Eosinophils Relative: 2
Lymphocytes Relative: 22

## 2010-11-03 LAB — BASIC METABOLIC PANEL
BUN: 12
CO2: 27
GFR calc non Af Amer: >60
Glucose, Bld: 112 — ABNORMAL HIGH
Potassium: 3.9

## 2010-11-07 ENCOUNTER — Other Ambulatory Visit: Payer: Self-pay | Admitting: Orthopaedic Surgery

## 2010-11-07 DIAGNOSIS — M549 Dorsalgia, unspecified: Secondary | ICD-10-CM

## 2010-11-07 LAB — COMPREHENSIVE METABOLIC PANEL
ALT: 254 — ABNORMAL HIGH
AST: 161 — ABNORMAL HIGH
CO2: 28
Calcium: 9.5
Chloride: 105
Creatinine, Ser: 0.7
GFR calc Af Amer: >60
GFR calc non Af Amer: >60
Glucose, Bld: 119 — ABNORMAL HIGH
Total Bilirubin: 0.7

## 2010-11-07 LAB — URINALYSIS, ROUTINE W REFLEX MICROSCOPIC
Bilirubin Urine: NEGATIVE
Ketones, ur: NEGATIVE
Nitrite: NEGATIVE
Protein, ur: NEGATIVE
Urobilinogen, UA: 1
pH: 7.5

## 2010-11-07 LAB — CBC
Hemoglobin: 14.7
MCHC: 32.8
MCV: 89.3
Platelets: 376
RBC: 5.01
RBC: 5.03
WBC: 11 — ABNORMAL HIGH
WBC: 7.8

## 2010-11-07 LAB — BASIC METABOLIC PANEL
BUN: 7
Calcium: 9.2
Chloride: 104
Creatinine, Ser: 0.82
GFR calc Af Amer: >60
GFR calc non Af Amer: >60

## 2010-11-07 LAB — DIFFERENTIAL
Basophils Absolute: 0.2 — ABNORMAL HIGH
Eosinophils Absolute: 0.2
Eosinophils Relative: 3
Lymphocytes Relative: 25
Lymphocytes Relative: 28
Lymphs Abs: 2.2
Lymphs Abs: 2.8
Monocytes Relative: 8
Neutro Abs: 6.9
Neutrophils Relative %: 57
Neutrophils Relative %: 63

## 2010-11-07 LAB — LIPASE, BLOOD: Lipase: 20

## 2010-11-08 LAB — DIFFERENTIAL
Basophils Absolute: 0
Basophils Absolute: 0
Basophils Relative: 0
Eosinophils Relative: 0
Eosinophils Relative: 0
Lymphocytes Relative: 12
Lymphs Abs: 1.8
Monocytes Absolute: 0.6
Monocytes Absolute: 1 — ABNORMAL HIGH
Monocytes Relative: 4
Neutro Abs: 11.9 — ABNORMAL HIGH

## 2010-11-08 LAB — COMPREHENSIVE METABOLIC PANEL
AST: 16
AST: 25
Albumin: 4.3
Albumin: 4.5
Alkaline Phosphatase: 84
BUN: 13
Chloride: 102
Chloride: 92 — ABNORMAL LOW
Creatinine, Ser: 1.03
GFR calc Af Amer: >60
Potassium: 2.8 — ABNORMAL LOW
Potassium: 3.2 — ABNORMAL LOW
Total Bilirubin: 1.7 — ABNORMAL HIGH
Total Bilirubin: 2.7 — ABNORMAL HIGH
Total Protein: 7.4

## 2010-11-08 LAB — CBC
HCT: 47.5
Hemoglobin: 16.4
MCV: 86.9
Platelets: 449 — ABNORMAL HIGH
Platelets: 464 — ABNORMAL HIGH
RBC: 5.54
RDW: 13.3
WBC: 14.6 — ABNORMAL HIGH
WBC: 15.4 — ABNORMAL HIGH

## 2010-11-08 LAB — URINALYSIS, ROUTINE W REFLEX MICROSCOPIC
Glucose, UA: NEGATIVE
Hgb urine dipstick: NEGATIVE
Ketones, ur: >80 — AB
Protein, ur: 30 — AB
Urobilinogen, UA: 1
pH: 8

## 2010-11-08 LAB — URINE MICROSCOPIC-ADD ON

## 2010-11-13 ENCOUNTER — Inpatient Hospital Stay (HOSPITAL_COMMUNITY): Admission: RE | Admit: 2010-11-13 | Payer: No Typology Code available for payment source | Source: Ambulatory Visit

## 2010-11-16 LAB — BASIC METABOLIC PANEL
BUN: 21
CO2: 28
Chloride: 100
Glucose, Bld: 139 — ABNORMAL HIGH
Potassium: 4.3

## 2010-11-27 ENCOUNTER — Ambulatory Visit
Admission: RE | Admit: 2010-11-27 | Discharge: 2010-11-27 | Disposition: A | Discharge status: Home / Self Care | Payer: PRIVATE HEALTH INSURANCE | Source: Ambulatory Visit | Attending: Orthopaedic Surgery | Admitting: Orthopaedic Surgery

## 2010-11-27 DIAGNOSIS — M549 Dorsalgia, unspecified: Secondary | ICD-10-CM

## 2010-11-27 MED ORDER — IOHEXOL 180 MG/ML  SOLN
1.0000 mL | Freq: Once | INTRAMUSCULAR | Status: AC | PRN
  Administered 2010-11-27: 1 mL via EPIDURAL

## 2010-11-27 MED ORDER — METHYLPREDNISOLONE ACETATE 40 MG/ML INJ SUSP (RADIOLOG
120.0000 mg | Freq: Once | INTRAMUSCULAR | Status: AC
  Administered 2010-11-27: 120 mg via EPIDURAL

## 2010-12-10 ENCOUNTER — Encounter (HOSPITAL_BASED_OUTPATIENT_CLINIC_OR_DEPARTMENT_OTHER): Payer: Self-pay | Admitting: Emergency Medicine

## 2010-12-10 ENCOUNTER — Emergency Department (HOSPITAL_BASED_OUTPATIENT_CLINIC_OR_DEPARTMENT_OTHER)
Admission: EM | Admit: 2010-12-10 | Discharge: 2010-12-10 | Disposition: A | Discharge status: Home / Self Care | Payer: No Typology Code available for payment source | Attending: Emergency Medicine | Admitting: Emergency Medicine

## 2010-12-10 ENCOUNTER — Emergency Department (INDEPENDENT_AMBULATORY_CARE_PROVIDER_SITE_OTHER): Payer: No Typology Code available for payment source

## 2010-12-10 DIAGNOSIS — R071 Chest pain on breathing: Secondary | ICD-10-CM | POA: Insufficient documentation

## 2010-12-10 DIAGNOSIS — S139XXA Sprain of joints and ligaments of unspecified parts of neck, initial encounter: Secondary | ICD-10-CM | POA: Insufficient documentation

## 2010-12-10 DIAGNOSIS — M25559 Pain in unspecified hip: Secondary | ICD-10-CM

## 2010-12-10 DIAGNOSIS — S161XXA Strain of muscle, fascia and tendon at neck level, initial encounter: Secondary | ICD-10-CM

## 2010-12-10 DIAGNOSIS — F17200 Nicotine dependence, unspecified, uncomplicated: Secondary | ICD-10-CM | POA: Insufficient documentation

## 2010-12-10 DIAGNOSIS — R0789 Other chest pain: Secondary | ICD-10-CM

## 2010-12-10 DIAGNOSIS — Y92410 Unspecified street and highway as the place of occurrence of the external cause: Secondary | ICD-10-CM | POA: Insufficient documentation

## 2010-12-10 DIAGNOSIS — R0602 Shortness of breath: Secondary | ICD-10-CM | POA: Insufficient documentation

## 2010-12-10 MED ORDER — HYDROCODONE-ACETAMINOPHEN 5-500 MG PO TABS: 1.0000 | ORAL_TABLET | Freq: Four times a day (QID) | ORAL | Status: AC | PRN

## 2010-12-10 MED ORDER — IBUPROFEN 800 MG PO TABS: 800.0000 mg | ORAL_TABLET | Freq: Three times a day (TID) | ORAL | Status: AC

## 2010-12-10 MED ORDER — HYDROCODONE-ACETAMINOPHEN 5-325 MG PO TABS
1.0000 | ORAL_TABLET | Freq: Once | ORAL | Status: AC
  Administered 2010-12-10: 1 via ORAL
  Filled 2010-12-10: qty 1
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
## 2010-12-10 NOTE — ED Provider Notes (Signed)
Medical screening examination/treatment/procedure(s) were performed by non-physician practitioner and as supervising physician I was immediately available for consultation/collaboration.   Forbes Cellar, MD 12/10/10 [phone]
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
## 2011-01-12 ENCOUNTER — Other Ambulatory Visit: Payer: Self-pay

## 2011-01-12 ENCOUNTER — Emergency Department (HOSPITAL_COMMUNITY): Payer: Self-pay

## 2011-01-12 ENCOUNTER — Encounter (HOSPITAL_COMMUNITY): Payer: Self-pay | Admitting: *Deleted

## 2011-01-12 ENCOUNTER — Emergency Department (HOSPITAL_COMMUNITY)
Admission: EM | Admit: 2011-01-12 | Discharge: 2011-01-12 | Disposition: A | Discharge status: Home / Self Care | Payer: Self-pay | Attending: Emergency Medicine | Admitting: Emergency Medicine

## 2011-01-12 DIAGNOSIS — R05 Cough: Secondary | ICD-10-CM | POA: Insufficient documentation

## 2011-01-12 DIAGNOSIS — R079 Chest pain, unspecified: Secondary | ICD-10-CM | POA: Insufficient documentation

## 2011-01-12 DIAGNOSIS — R059 Cough, unspecified: Secondary | ICD-10-CM | POA: Insufficient documentation

## 2011-01-12 DIAGNOSIS — R42 Dizziness and giddiness: Secondary | ICD-10-CM | POA: Insufficient documentation

## 2011-01-12 HISTORY — DX: Anxiety disorder, unspecified: F41.9

## 2011-01-12 LAB — BASIC METABOLIC PANEL
BUN: 17 mg/dL (ref 6–23)
Chloride: 103 mEq/L (ref 96–112)
GFR calc Af Amer: >90 mL/min (ref >90)
Glucose, Bld: 124 mg/dL — ABNORMAL HIGH (ref 70–99)
Potassium: 4 mEq/L (ref 3.5–5.1)

## 2011-01-12 MED ORDER — OXYCODONE-ACETAMINOPHEN 5-325 MG PO TABS: 1.0000 | ORAL_TABLET | Freq: Four times a day (QID) | ORAL | Status: AC | PRN

## 2011-01-12 MED ORDER — OXYCODONE-ACETAMINOPHEN 5-325 MG PO TABS
1.0000 | ORAL_TABLET | Freq: Once | ORAL | Status: AC
  Administered 2011-01-12: 1 via ORAL
  Filled 2011-01-12: qty 1
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
## 2011-01-12 NOTE — ED Notes (Signed)
Pt states "these chest pains have been going on off & on for about a yr, I feel dizzy, felt like I was going to throw up, it's sharp pain above my heart, tx'd for anxiety"

## 2011-01-12 NOTE — ED Notes (Signed)
Patient is alert and oriented x3. He has been given DC instructions with MD follow up visits.  Verbal confirmation was given by patient V/S stable.  Patient DC under own ambulatory power. Patient was not showing any signs of distress on DC

## 2011-01-12 NOTE — ED Notes (Signed)
Patient states that he feels like he had a panic attack and has been coughing up mucous for the past 3 days.

## 2011-02-22 ENCOUNTER — Emergency Department (INDEPENDENT_AMBULATORY_CARE_PROVIDER_SITE_OTHER): Payer: No Typology Code available for payment source

## 2011-02-22 ENCOUNTER — Encounter (HOSPITAL_COMMUNITY): Payer: Self-pay | Admitting: Emergency Medicine

## 2011-02-22 ENCOUNTER — Emergency Department (INDEPENDENT_AMBULATORY_CARE_PROVIDER_SITE_OTHER)
Admission: EM | Admit: 2011-02-22 | Discharge: 2011-02-22 | Disposition: A | Discharge status: Home / Self Care | Payer: Self-pay | Source: Home / Self Care | Attending: Emergency Medicine | Admitting: Emergency Medicine

## 2011-02-22 DIAGNOSIS — M25511 Pain in right shoulder: Secondary | ICD-10-CM

## 2011-02-22 DIAGNOSIS — M25519 Pain in unspecified shoulder: Secondary | ICD-10-CM

## 2011-02-22 HISTORY — DX: Unspecified viral hepatitis C without hepatic coma: B19.20

## 2011-02-22 MED ORDER — HYDROCODONE-ACETAMINOPHEN 5-325 MG PO TABS: 2.0000 | ORAL_TABLET | ORAL | Status: AC | PRN

## 2011-02-22 MED ORDER — HYDROCODONE-ACETAMINOPHEN 5-325 MG PO TABS
2.0000 | ORAL_TABLET | Freq: Once | ORAL | Status: AC
  Administered 2011-02-22: 2 via ORAL

## 2011-02-22 MED ORDER — IBUPROFEN 800 MG PO TABS
ORAL_TABLET | ORAL | Status: AC
  Filled 2011-02-22: qty 1

## 2011-02-22 MED ORDER — HYDROCODONE-ACETAMINOPHEN 5-325 MG PO TABS
ORAL_TABLET | ORAL | Status: AC
  Filled 2011-02-22: qty 2

## 2011-02-22 MED ORDER — NAPROXEN 500 MG PO TABS: 500.0000 mg | ORAL_TABLET | Freq: Two times a day (BID) | ORAL | Status: DC

## 2011-02-22 MED ORDER — IBUPROFEN 800 MG PO TABS
800.0000 mg | ORAL_TABLET | Freq: Once | ORAL | Status: AC
  Administered 2011-02-22: 800 mg via ORAL

## 2011-02-22 NOTE — ED Notes (Signed)
Right radial pulses 2 plus, brisk cap refill.

## 2011-02-22 NOTE — ED Notes (Signed)
Right shoulder pain.  Patient noted right shoulder pain after hearing a pop in right shoulder.  When pop heard, patient was dumping a large pot of soup, shoulders were rotated forward.  Patient reports several surgeries on this shoulder.  Patient describes extensive work Chartered certified accountant.  Patient did go to chiropractor prior to coming [facility] .  Reports acupuncture and ultrasound to break up tissue, no relief to any degree.  Pain experienced without any movement, then escalates with movement.

## 2011-02-22 NOTE — ED Notes (Signed)
Provided comfort measures: assisted in gown, raised back of bed, provided pillow under arm.
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
## 2011-02-22 NOTE — ED Notes (Signed)
Provided sprite and ice at patient's preference, declined any crackers

## 2011-02-24 ENCOUNTER — Emergency Department (HOSPITAL_COMMUNITY)
Admission: EM | Admit: 2011-02-24 | Discharge: 2011-02-24 | Disposition: A | Discharge status: Home / Self Care | Payer: Self-pay | Attending: Emergency Medicine | Admitting: Emergency Medicine

## 2011-02-24 ENCOUNTER — Other Ambulatory Visit: Payer: Self-pay

## 2011-02-24 ENCOUNTER — Encounter (HOSPITAL_COMMUNITY): Payer: Self-pay | Admitting: Adult Health

## 2011-02-24 ENCOUNTER — Emergency Department (HOSPITAL_COMMUNITY): Payer: Self-pay

## 2011-02-24 DIAGNOSIS — R509 Fever, unspecified: Secondary | ICD-10-CM | POA: Insufficient documentation

## 2011-02-24 DIAGNOSIS — R079 Chest pain, unspecified: Secondary | ICD-10-CM | POA: Insufficient documentation

## 2011-02-24 DIAGNOSIS — F17200 Nicotine dependence, unspecified, uncomplicated: Secondary | ICD-10-CM | POA: Insufficient documentation

## 2011-02-24 DIAGNOSIS — R0682 Tachypnea, not elsewhere classified: Secondary | ICD-10-CM | POA: Insufficient documentation

## 2011-02-24 DIAGNOSIS — R599 Enlarged lymph nodes, unspecified: Secondary | ICD-10-CM | POA: Insufficient documentation

## 2011-02-24 DIAGNOSIS — IMO0002 Reserved for concepts with insufficient information to code with codable children: Secondary | ICD-10-CM | POA: Insufficient documentation

## 2011-02-24 DIAGNOSIS — Z8619 Personal history of other infectious and parasitic diseases: Secondary | ICD-10-CM | POA: Insufficient documentation

## 2011-02-24 DIAGNOSIS — J189 Pneumonia, unspecified organism: Secondary | ICD-10-CM | POA: Insufficient documentation

## 2011-02-24 DIAGNOSIS — R000 Tachycardia, unspecified: Secondary | ICD-10-CM | POA: Insufficient documentation

## 2011-02-24 LAB — POCT I-STAT, CHEM 8
Chloride: 104 mEq/L (ref 96–112)
Creatinine, Ser: 0.9 mg/dL (ref 0.50–1.35)
Glucose, Bld: 142 mg/dL — ABNORMAL HIGH (ref 70–99)
HCT: 44 % (ref 39.0–52.0)
Potassium: 3.9 mEq/L (ref 3.5–5.1)

## 2011-02-24 MED ORDER — SODIUM CHLORIDE 0.9 % IV SOLN
Freq: Once | INTRAVENOUS | Status: AC
  Administered 2011-02-24: 15:00:00 via INTRAVENOUS

## 2011-02-24 MED ORDER — HYDROCODONE-ACETAMINOPHEN 7.5-750 MG PO TABS: 1.0000 | ORAL_TABLET | Freq: Four times a day (QID) | ORAL | Status: DC | PRN

## 2011-02-24 MED ORDER — IOHEXOL 300 MG/ML  SOLN
100.0000 mL | Freq: Once | INTRAMUSCULAR | Status: AC | PRN
  Administered 2011-02-24: 100 mL via INTRAVENOUS

## 2011-02-24 MED ORDER — HYDROCODONE-ACETAMINOPHEN 5-325 MG PO TABS
1.0000 | ORAL_TABLET | Freq: Once | ORAL | Status: AC
  Administered 2011-02-24: 1 via ORAL
  Filled 2011-02-24: qty 1

## 2011-02-24 MED ORDER — HYDROCODONE-ACETAMINOPHEN 5-500 MG PO TABS: 1.0000 | ORAL_TABLET | Freq: Four times a day (QID) | ORAL | Status: AC | PRN

## 2011-02-24 MED ORDER — KETOROLAC TROMETHAMINE 30 MG/ML IJ SOLN
30.0000 mg | Freq: Once | INTRAMUSCULAR | Status: AC
  Administered 2011-02-24: 30 mg via INTRAVENOUS
  Filled 2011-02-24: qty 1

## 2011-02-24 MED ORDER — DEXTROSE 5 % IV SOLN
500.0000 mg | Freq: Once | INTRAVENOUS | Status: AC
  Administered 2011-02-24 (×2): 500 mg via INTRAVENOUS
  Filled 2011-02-24: qty 500

## 2011-02-24 MED ORDER — AZITHROMYCIN 250 MG PO TABS: 500.0000 mg | ORAL_TABLET | Freq: Every day | ORAL | Status: AC

## 2011-02-24 NOTE — ED Notes (Signed)
Presents with stabbing back pain in lower right lung field, HR 137, current everyday smioker, diagnosed with heart arrhythmia due to stress and anxiety, tried xanax with no relief.
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
## 2011-03-26 ENCOUNTER — Encounter (HOSPITAL_COMMUNITY): Payer: Self-pay

## 2011-03-26 ENCOUNTER — Emergency Department (INDEPENDENT_AMBULATORY_CARE_PROVIDER_SITE_OTHER)
Admission: EM | Admit: 2011-03-26 | Discharge: 2011-03-26 | Disposition: A | Discharge status: Home Health | Payer: Self-pay | Source: Home / Self Care | Attending: Emergency Medicine | Admitting: Emergency Medicine

## 2011-03-26 ENCOUNTER — Emergency Department (INDEPENDENT_AMBULATORY_CARE_PROVIDER_SITE_OTHER): Payer: No Typology Code available for payment source

## 2011-03-26 DIAGNOSIS — M778 Other enthesopathies, not elsewhere classified: Secondary | ICD-10-CM

## 2011-03-26 DIAGNOSIS — M65839 Other synovitis and tenosynovitis, unspecified forearm: Secondary | ICD-10-CM

## 2011-03-26 DIAGNOSIS — M65849 Other synovitis and tenosynovitis, unspecified hand: Secondary | ICD-10-CM

## 2011-03-26 MED ORDER — HYDROCODONE-ACETAMINOPHEN 5-325 MG PO TABS: 2.0000 | ORAL_TABLET | ORAL | Status: DC | PRN

## 2011-03-26 MED ORDER — HYDROCODONE-ACETAMINOPHEN 5-325 MG PO TABS
ORAL_TABLET | ORAL | Status: AC
  Filled 2011-03-26: qty 2

## 2011-03-26 MED ORDER — HYDROCODONE-ACETAMINOPHEN 5-325 MG PO TABS
2.0000 | ORAL_TABLET | Freq: Once | ORAL | Status: AC
  Administered 2011-03-26: 2 via ORAL

## 2011-03-26 MED ORDER — IBUPROFEN 800 MG PO TABS: 800.0000 mg | ORAL_TABLET | Freq: Three times a day (TID) | ORAL | Status: DC
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
## 2011-03-26 NOTE — Discharge Instructions (Signed)
Tendinitis Tendinitis is swelling and inflammation of the tendons. Tendons are band-like tissues that connect muscle to bone. Tendinitis commonly occurs in the:   Shoulders (rotator cuff).   Heels (Achilles tendon).   Elbows (triceps tendon).  CAUSES Tendinitis is usually caused by overusing the tendon, muscles, and joints involved. When the tissue surrounding a tendon (synovium) becomes inflamed, it is called tenosynovitis. Tendinitis commonly develops in people whose jobs require repetitive motions. SYMPTOMS  Pain.   Tenderness.   Mild swelling.  DIAGNOSIS Tendinitis is usually diagnosed by physical exam. Your caregiver may also order X-rays or other imaging tests. TREATMENT Your caregiver may recommend certain medicines or exercises for your treatment. HOME CARE INSTRUCTIONS   Use a sling or splint for as long as directed by your caregiver until the pain decreases.   Put ice on the injured area.   Put ice in a plastic bag.   Place a towel between your skin and the bag.   Leave the ice on for 15 to 20 minutes, 3 to 4 times a day.   Avoid using the limb while the tendon is painful. Perform gentle range of motion exercises only as directed by your caregiver. Stop exercises if pain or discomfort increase, unless directed otherwise by your caregiver.   Only take over-the-counter or prescription medicines for pain, discomfort, or fever as directed by your caregiver.  SEEK MEDICAL CARE IF:   Your pain and swelling increase.   You develop new, unexplained symptoms, especially increased numbness in the hands.  MAKE SURE YOU:   Understand these instructions.   Will watch your condition.   Will get help right away if you are not doing well or get worse.  Document Released: 01/13/2000 Document Revised: 09/27/2010 Document Reviewed: 04/03/2010 [employer] Patient Information 2012 [employer]. 

## 2011-03-26 NOTE — ED Notes (Signed)
C/o rt wrist pain for 1 week.  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  Also reports hitting his wrist on a shelf approx.  1 1/2 weeks ago.

## 2011-03-28 ENCOUNTER — Encounter (HOSPITAL_COMMUNITY): Payer: Self-pay | Admitting: *Deleted

## 2011-03-28 ENCOUNTER — Emergency Department (INDEPENDENT_AMBULATORY_CARE_PROVIDER_SITE_OTHER)
Admission: EM | Admit: 2011-03-28 | Discharge: 2011-03-28 | Disposition: A | Discharge status: Home / Self Care | Payer: Self-pay | Source: Home / Self Care | Attending: Family Medicine | Admitting: Family Medicine

## 2011-03-28 DIAGNOSIS — M654 Radial styloid tenosynovitis [de Quervain]: Secondary | ICD-10-CM

## 2011-03-28 DIAGNOSIS — M25531 Pain in right wrist: Secondary | ICD-10-CM

## 2011-03-28 DIAGNOSIS — M25539 Pain in unspecified wrist: Secondary | ICD-10-CM

## 2011-03-28 MED ORDER — MELOXICAM 15 MG PO TABS: 15.0000 mg | ORAL_TABLET | Freq: Every day | ORAL | Status: DC

## 2011-03-28 MED ORDER — HYDROCODONE-ACETAMINOPHEN 5-500 MG PO TABS: 1.0000 | ORAL_TABLET | Freq: Four times a day (QID) | ORAL | Status: AC | PRN

## 2011-03-28 MED ORDER — TRIAMCINOLONE ACETONIDE 40 MG/ML IJ SUSP
INTRAMUSCULAR | Status: AC
  Filled 2011-03-28: qty 5

## 2011-03-28 NOTE — ED Provider Notes (Signed)
Lawrence [name] is a 26 y.o. male who presents to Urgent Care today for right wrist pain for the last 2-3 weeks.  Patient notes pain in the anatomical snuff box and on the ulnar aspect of his wrist.  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  He was seen in the emergency room on 25 February and given a non-thumb spica wrist splint.  He was asked to followup with orthopedics which he is unable to do to lack of insurance and money until next week when he gets paid.  He continues to have pain and has run out of pain medicine, and needs to continue working.   Of note x-ray at the emergency room of rest was negative and there was no history of injury.  PMH reviewed. Significant for anxiety ROS as above otherwise neg Medications reviewed. No current facility-administered medications for this encounter.   Current Outpatient Prescriptions  Medication Sig Dispense Refill  . ALPRAZolam (XANAX) 1 MG tablet Take 1 mg by mouth 2 (two) times daily.        Marland Kitchen DM-Doxylamine-Acetaminophen (VICKS NYQUIL COLD & FLU NIGHT) 15-6.25-325 MG/15ML LIQD Take 15 mLs by mouth at bedtime as needed. For sore throat and sleep      . HYDROcodone-acetaminophen (VICODIN) 5-500 MG per tablet Take 1 tablet by mouth every 6 (six) hours as needed for pain.  20 tablet  0  . meloxicam (MOBIC) 15 MG tablet Take 1 tablet (15 mg total) by mouth daily.  14 tablet  0    Exam:  BP 117/77  Pulse 74  Temp(Src) 99.4 F (37.4 C) (Oral)  Resp 16  SpO2 100% Gen: Well NAD MSK: Normal-appearing wrist and hand. Wrist is tender to ulnar deviation, and touch over over stylette and thumb extensor tendons.  Normal wrist motion extension and flexion. Normal hand motion sensation and strength.    Procedure note: Consent obtained and timeout performed. Discussed possibility of hypopigmentation infection damage to tendons and other structures and bleeding.  Area cleaned with Betadine.   Cold spray applied and using a 30-gauge needle 0.8 mL of 25% 40 mg/mL triamcinolone solution, and 75% 2% lidocaine without epinephrine was injected along the tendon sheath of the pollicis tendons.  No bleeding patient tolerated the procedure well.  Assessment and Plan: 26 year old male with wrist pain likely do to overuse at work. 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   Additionally the non-spica splint was not providing stability for ulnar deviation.    Today plan for de Quervain's and injection and thumb spica splint. Additionally plan for 2 weeks of meloxicam and Vicodin as needed Following the injection he feels improvement and will followup with orthopedics if not improving. Discussed warning signs for infection or nonimprovement with patient who expresses understanding.      Lawrence Graham, MD 03/28/11 [phone]

## 2011-03-28 NOTE — Discharge Instructions (Signed)
Thank you for coming in today. Use the splint for 2 weeks.  Apply a bag of frozen peas 15 mins 3x a day.  Take the meloxicam daily. No ibuprofen or naprosyn.  Follow up with orthopedics if not better.  Watch out for fever, redness, worsening pain.  You should start getting better in a few days.   De Quervain's Tenosynovitis De Quervain's tenosynovitis involves inflammation of one or two tendon linings (sheaths) or strain of one or two tendons to the thumb: extensor pollicis brevis (EPB), or abductor pollicis longus (APL). This causes pain on the side of the wrist and base of the thumb. Tendon sheaths secrete a fluid that lubricates the tendon, allowing the tendon to move smoothly. When the sheath becomes inflamed, the tendon cannot move freely in the sheath. Both the EPB and APL tendons are important for proper use of the hand. The EPB tendon is important for straightening the thumb. The APL tendon is important for moving the thumb away from the index finger (abducting). The two tendons pass through a small tube (canal) in the wrist, near the base of the thumb. When the tendons become inflamed, pain is usually felt in this area. SYMPTOMS   Pain, tenderness, swelling, warmth, or redness over the base of the thumb and thumb side of the wrist.   Pain that gets worse when straightening the thumb.   Pain that gets worse when moving the thumb away from the index finger, against resistance.   Pain with pinching or gripping.   Locking or catching of the thumb.   Limited motion of the thumb.   Crackling sound (crepitation) when the tendon or thumb is moved or touched.   Fluid-filled cyst in the area of the base of the thumb.  CAUSES   Tenosynovitis is often linked with overuse of the wrist.   Tenosynovitis may be caused by repeated injury to the thumb muscle and tendon units, and with repeated motions of the hand and wrist, due to friction of the tendon within the lining (sheath).    Tenosynovitis may also be due to a sudden increase in activity or change in activity.  RISK INCREASES WITH:  Sports that involve repeated hand and wrist motions (golf, bowling, tennis, squash, racquetball).   Heavy labor.   Poor physical wrist strength and flexibility.   Failure to warm up properly before practice or play.   Male gender.   New mothers who hold their baby's head for long periods or lift infants with thumbs in the infant's armpit (axilla).  PREVENTION  Warm up and stretch properly before practice or competition.   Allow enough time for rest and recovery between practices and competition.   Maintain appropriate conditioning:   Cardiovascular fitness.   Forearm, wrist, and hand flexibility.   Muscle strength and endurance.   Use proper exercise technique.  PROGNOSIS  This condition is usually curable within 6 weeks, if treated properly with non-surgical treatment and resting of the affected area.  RELATED COMPLICATIONS   Longer healing time if not properly treated or if not given enough time to heal.   Chronic inflammation, causing recurring symptoms of tenosynovitis. Permanent pain or restriction of movement.   Risks of surgery: infection, bleeding, injury to nerves (numbness of the thumb), continued pain, incomplete release of the tendon sheath, recurring symptoms, cutting of the tendons, tendons sliding out of position, weakness of the thumb, thumb stiffness.  TREATMENT  First, treatment involves the use of medicine and ice, to reduce pain  and inflammation. Patients are encouraged to stop or modify activities that aggravate the injury. Stretching and strengthening exercises may be advised. Exercises may be completed at home or with a therapist. You may be fitted with a brace or splint, to limit motion and allow the injury to heal. Your caregiver may also choose to give you a corticosteroid injection, to reduce the pain and inflammation. If non-surgical  treatment is not successful, surgery may be needed. Most tenosynovitis surgeries are done as outpatient procedures (you go home the same day). Surgery may involve local, regional (whole arm), or general anesthesia.  MEDICATION   If pain medicine is needed, nonsteroidal anti-inflammatory medicines (aspirin and ibuprofen), or other minor pain relievers (acetaminophen), are often advised.   Do not take pain medicine for 7 days before surgery.   Prescription pain relievers are often prescribed only after surgery. Use only as directed and only as much as you need.   Corticosteroid injections may be given if your caregiver thinks they are needed. There is a limited number of times these injections may be given.  COLD THERAPY   Cold treatment (icing) should be applied for 10 to 15 minutes every 2 to 3 hours for inflammation and pain, and immediately after activity that aggravates your symptoms. Use ice packs or an ice massage.  SEEK MEDICAL CARE IF:   Symptoms get worse or do not improve in 2 to 4 weeks, despite treatment.   You experience pain, numbness, or coldness in the hand.   Blue, gray, or dark color appears in the fingernails.   Any of the following occur after surgery: increased pain, swelling, redness, drainage of fluids, bleeding in the affected area, or signs of infection.   New, unexplained symptoms develop. (Drugs used in treatment may produce side effects.)  Document Released: 01/15/2005 Document Revised: 09/27/2010 Document Reviewed: 04/29/2008 [facility] Patient Information 2012 [place], [place].

## 2011-03-28 NOTE — ED Notes (Signed)
Pt here for f/u of right wrist pain.  Seen here on Monday and was told he had tendonitis.  States he's been taking Motrin 800mg  twice a day and Vicodin and neither is helping.  States pain is worse and now more swollen.  Also has been using ice without relief.

## 2011-03-30 NOTE — ED Provider Notes (Signed)
Medical screening examination/treatment/procedure(s) were performed by non-physician practitioner and as supervising physician I was immediately available for consultation/collaboration.  Luiz Blare MD   Luiz Blare, MD 03/30/11 [phone]

## 2011-03-30 NOTE — ED Provider Notes (Signed)
Medical screening examination/treatment/procedure(s) were performed by resident physician or non-physician practitioner and as supervising physician I was immediately available for consultation/collaboration.   Barkley Bruns MD.    Barkley Bruns, MD 03/30/11 [phone]

## 2011-04-23 ENCOUNTER — Encounter (HOSPITAL_COMMUNITY): Payer: Self-pay | Admitting: *Deleted

## 2011-04-23 ENCOUNTER — Emergency Department (HOSPITAL_COMMUNITY)
Admission: EM | Admit: 2011-04-23 | Discharge: 2011-04-23 | Disposition: A | Discharge status: Home / Self Care | Payer: Self-pay | Attending: Emergency Medicine | Admitting: Emergency Medicine

## 2011-04-23 DIAGNOSIS — F17200 Nicotine dependence, unspecified, uncomplicated: Secondary | ICD-10-CM | POA: Insufficient documentation

## 2011-04-23 DIAGNOSIS — M25569 Pain in unspecified knee: Secondary | ICD-10-CM | POA: Insufficient documentation

## 2011-04-23 DIAGNOSIS — M545 Low back pain, unspecified: Secondary | ICD-10-CM | POA: Insufficient documentation

## 2011-04-23 DIAGNOSIS — F341 Dysthymic disorder: Secondary | ICD-10-CM | POA: Insufficient documentation

## 2011-04-23 DIAGNOSIS — M79609 Pain in unspecified limb: Secondary | ICD-10-CM | POA: Insufficient documentation

## 2011-04-23 DIAGNOSIS — Z79899 Other long term (current) drug therapy: Secondary | ICD-10-CM | POA: Insufficient documentation

## 2011-04-23 DIAGNOSIS — X500XXA Overexertion from strenuous movement or load, initial encounter: Secondary | ICD-10-CM | POA: Insufficient documentation

## 2011-04-23 DIAGNOSIS — M5380 Other specified dorsopathies, site unspecified: Secondary | ICD-10-CM | POA: Insufficient documentation

## 2011-04-23 DIAGNOSIS — Z8619 Personal history of other infectious and parasitic diseases: Secondary | ICD-10-CM | POA: Insufficient documentation

## 2011-04-23 DIAGNOSIS — S339XXA Sprain of unspecified parts of lumbar spine and pelvis, initial encounter: Secondary | ICD-10-CM | POA: Insufficient documentation

## 2011-04-23 DIAGNOSIS — S39012A Strain of muscle, fascia and tendon of lower back, initial encounter: Secondary | ICD-10-CM

## 2011-04-23 MED ORDER — HYDROMORPHONE HCL PF 2 MG/ML IJ SOLN
2.0000 mg | Freq: Once | INTRAMUSCULAR | Status: AC
  Administered 2011-04-23: 2 mg via INTRAMUSCULAR
  Filled 2011-04-23: qty 1

## 2011-04-23 MED ORDER — OXYCODONE-ACETAMINOPHEN 5-325 MG PO TABS: 1.0000 | ORAL_TABLET | Freq: Four times a day (QID) | ORAL | Status: AC | PRN

## 2011-04-23 MED ORDER — IBUPROFEN 800 MG PO TABS
800.0000 mg | ORAL_TABLET | Freq: Once | ORAL | Status: AC
  Administered 2011-04-23: 800 mg via ORAL
  Filled 2011-04-23: qty 1

## 2011-04-23 MED ORDER — NAPROXEN 500 MG PO TABS: 500.0000 mg | ORAL_TABLET | Freq: Two times a day (BID) | ORAL | Status: DC

## 2011-04-23 MED ORDER — CYCLOBENZAPRINE HCL 10 MG PO TABS: 10.0000 mg | ORAL_TABLET | Freq: Three times a day (TID) | ORAL | Status: AC | PRN

## 2011-04-23 NOTE — ED Notes (Signed)
md at bedside

## 2011-04-23 NOTE — ED Notes (Signed)
Pt reports trying to lift grill into a truck last night and slipped. Has had a stabbing lower back pain since. Has tried ice, heat and vicodin without relief.

## 2011-04-23 NOTE — ED Notes (Signed)
Pt stating in triage that he "can't control his breathing, I'm getting dizzy."

## 2011-04-23 NOTE — ED Notes (Signed)
Pt alert and oriented x4. Respirations even and unlabored, bilateral symmetrical rise and fall of chest. Skin warm and dry. In no acute distress. Denies needs.  Denies any itching at present.

## 2011-04-23 NOTE — ED Notes (Signed)
Pt reports itching at injection site. Will monitor pt to watch for allergic rxn before discharging pt.

## 2011-04-23 NOTE — Discharge Instructions (Signed)
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
## 2011-04-23 NOTE — ED Notes (Signed)
Pt alert and oriented x4. Respirations even and unlabored, bilateral symmetrical rise and fall of chest. Skin warm and dry. In no acute distress. Denies needs.   

## 2011-05-06 ENCOUNTER — Emergency Department (HOSPITAL_COMMUNITY)
Admission: EM | Admit: 2011-05-06 | Discharge: 2011-05-06 | Disposition: A | Discharge status: Home / Self Care | Payer: Self-pay | Attending: Emergency Medicine | Admitting: Emergency Medicine

## 2011-05-06 ENCOUNTER — Encounter (HOSPITAL_COMMUNITY): Payer: Self-pay | Admitting: *Deleted

## 2011-05-06 DIAGNOSIS — M5380 Other specified dorsopathies, site unspecified: Secondary | ICD-10-CM | POA: Insufficient documentation

## 2011-05-06 DIAGNOSIS — M545 Low back pain, unspecified: Secondary | ICD-10-CM | POA: Insufficient documentation

## 2011-05-06 DIAGNOSIS — M5416 Radiculopathy, lumbar region: Secondary | ICD-10-CM

## 2011-05-06 DIAGNOSIS — Z8619 Personal history of other infectious and parasitic diseases: Secondary | ICD-10-CM | POA: Insufficient documentation

## 2011-05-06 DIAGNOSIS — IMO0002 Reserved for concepts with insufficient information to code with codable children: Secondary | ICD-10-CM | POA: Insufficient documentation

## 2011-05-06 DIAGNOSIS — F341 Dysthymic disorder: Secondary | ICD-10-CM | POA: Insufficient documentation

## 2011-05-06 DIAGNOSIS — Z79899 Other long term (current) drug therapy: Secondary | ICD-10-CM | POA: Insufficient documentation

## 2011-05-06 MED ORDER — PREDNISONE 50 MG PO TABS: 50.0000 mg | ORAL_TABLET | Freq: Every day | ORAL | Status: AC

## 2011-05-06 MED ORDER — MORPHINE SULFATE 4 MG/ML IJ SOLN
4.0000 mg | Freq: Once | INTRAMUSCULAR | Status: AC
  Administered 2011-05-06: 4 mg via INTRAMUSCULAR
  Filled 2011-05-06: qty 1

## 2011-05-06 MED ORDER — DIAZEPAM 5 MG PO TABS
5.0000 mg | ORAL_TABLET | Freq: Once | ORAL | Status: AC
  Administered 2011-05-06: 5 mg via ORAL
  Filled 2011-05-06: qty 1

## 2011-05-06 MED ORDER — HYDROCODONE-ACETAMINOPHEN 5-325 MG PO TABS: 1.0000 | ORAL_TABLET | Freq: Four times a day (QID) | ORAL | Status: AC | PRN

## 2011-05-06 MED ORDER — PREDNISONE 20 MG PO TABS
60.0000 mg | ORAL_TABLET | Freq: Once | ORAL | Status: AC
  Administered 2011-05-06: 60 mg via ORAL
  Filled 2011-05-06: qty 3

## 2011-05-06 MED ORDER — DIAZEPAM 5 MG PO TABS: 5.0000 mg | ORAL_TABLET | Freq: Three times a day (TID) | ORAL | Status: AC | PRN

## 2011-05-06 NOTE — Discharge Instructions (Signed)
Follow up with [facility]. Return here as needed. Ice and heat on your back.    RESOURCE GUIDE  Dental Problems  Patients with Medicaid: [facility] [phone] [address]                                           [phone] [address] Phone:  [phone]                                                  Phone:  [phone]  If unable to pay or uninsured, contact:  [facility] or [facility] to become qualified for the adult dental clinic.  Chronic Pain Problems Contact [facility]  [phone] Patients need to be referred by their primary care doctor.  Insufficient Money for Medicine Contact [employer] Way:  call "211" or [facility] [phone].  No Primary Care Doctor Call [facility]  [phone] Other agencies that provide inexpensive medical care    [facility]  [phone]    [facility]  [phone]    [facility]  [phone]    [facility]  [phone]    [facility]  9792472499    [facility]  [phone]  Psychological Services [facility]  [phone] [facility]  [phone] [facility] Mental Health   [phone] (emergency services [phone])  Substance Abuse Resources [facility]  ([phone] [facility] [phone] [facility] 9106501119 Floydene Flock [phone] Residential & Outpatient Substance Abuse Program  [phone]  Abuse/Neglect [facility] Child Abuse Hotline 912-109-6646 [facility] Child Abuse Hotline (938)498-4908 (After Hours)  Emergency Shelter [facility] 970-860-6396  Maternity Homes [facility] at [facility] [place] [facility] ([phone] [phone] [facility] [phone]  MRSA Hotline #:   534-769-5442    [facility] Resources  [facility]     [employer]                          [facility] Dept. [address] [place]                       [address]      [address]  [place]                                                Cristobal Goldmann Phone:  [phone]                                   Phone:  [phone]  Phone:  [phone]  [facility] Mental Health Phone:  [phone]  [facility] Child Abuse Hotline 5715755938 [phone]) [phone] (After Hours)

## 2011-05-06 NOTE — ED Notes (Signed)
Pt from home with reports of increased pain in lower back on left side with radiation down left leg, pt reports hx of chronic back pain but endorses re-injuring back 2 weeks ago while loading a grill into the back of a truck. Pt also reports being treated here for same with some improvement but reports that upon awakening this morning pain was excruciating with radiation down left leg.

## 2011-05-06 NOTE — ED Provider Notes (Signed)
Medical screening examination/treatment/procedure(s) were performed by non-physician practitioner and as supervising physician I was immediately available for consultation/collaboration.  Finnleigh Marchetti, MD 05/06/11 2325 
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
## 2011-05-19 ENCOUNTER — Encounter (HOSPITAL_COMMUNITY): Payer: Self-pay | Admitting: *Deleted

## 2011-05-19 ENCOUNTER — Emergency Department (HOSPITAL_COMMUNITY)
Admission: EM | Admit: 2011-05-19 | Discharge: 2011-05-19 | Disposition: A | Discharge status: Home / Self Care | Payer: Self-pay | Attending: Emergency Medicine | Admitting: Emergency Medicine

## 2011-05-19 DIAGNOSIS — M545 Low back pain, unspecified: Secondary | ICD-10-CM | POA: Insufficient documentation

## 2011-05-19 DIAGNOSIS — M25519 Pain in unspecified shoulder: Secondary | ICD-10-CM | POA: Insufficient documentation

## 2011-05-19 DIAGNOSIS — G8929 Other chronic pain: Secondary | ICD-10-CM

## 2011-05-19 DIAGNOSIS — H538 Other visual disturbances: Secondary | ICD-10-CM | POA: Insufficient documentation

## 2011-05-19 DIAGNOSIS — X500XXA Overexertion from strenuous movement or load, initial encounter: Secondary | ICD-10-CM | POA: Insufficient documentation

## 2011-05-19 HISTORY — DX: Dorsalgia, unspecified: M54.9

## 2011-05-19 HISTORY — DX: Pain in right shoulder: M25.511

## 2011-05-19 MED ORDER — OLOPATADINE HCL 0.2 % OP SOLN: 1.0000 [drp] | Freq: Every day | OPHTHALMIC | Status: DC

## 2011-05-19 MED ORDER — CYCLOBENZAPRINE HCL 10 MG PO TABS: 10.0000 mg | ORAL_TABLET | Freq: Three times a day (TID) | ORAL | Status: DC | PRN

## 2011-05-19 MED ORDER — CYCLOBENZAPRINE HCL 10 MG PO TABS: 10.0000 mg | ORAL_TABLET | Freq: Three times a day (TID) | ORAL | Status: AC | PRN

## 2011-05-19 MED ORDER — HYDROCODONE-ACETAMINOPHEN 5-325 MG PO TABS: 1.0000 | ORAL_TABLET | Freq: Four times a day (QID) | ORAL | Status: AC | PRN

## 2011-05-19 MED ORDER — KETOROLAC TROMETHAMINE 60 MG/2ML IM SOLN
60.0000 mg | Freq: Once | INTRAMUSCULAR | Status: AC
  Administered 2011-05-19: 60 mg via INTRAMUSCULAR
  Filled 2011-05-19: qty 2

## 2011-05-19 MED ORDER — ACETAMINOPHEN-CODEINE #3 300-30 MG PO TABS: 1.0000 | ORAL_TABLET | ORAL | Status: DC | PRN

## 2011-05-19 NOTE — ED Notes (Signed)
Patient called requesting a less expensive RX for opthalmic drops. Chart taken to EDP. New RX Optivar, one drop each eye daily. RX called to [facility] [phone].

## 2011-05-19 NOTE — ED Notes (Signed)
Pt from home with reports of lower back and right shoulder pain which is chronic; however, pt reports almost falling at work last night and re injuring right shoulder catching self. Pt also reports itchy, watery, burning eyes with difficulty focusing, all symptoms started about 4 days ago.

## 2011-05-19 NOTE — Discharge Instructions (Signed)
You will need to follow up with your doctor. Return here as needed. Ice and heat to your back.

## 2011-05-19 NOTE — ED Notes (Signed)
Pt c/o pain from IM - no edema or redness noted. Ice pack given
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
## 2011-05-21 ENCOUNTER — Telehealth: Payer: Self-pay

## 2011-05-21 NOTE — Telephone Encounter (Signed)
I called Lawrence [name] at [facility] and she stated they are not set up to accept patient's insurance type. Dr.Hopper please advise on new plan in getting patient assistance, would you like for me to try one of the Home Health agencies? If yes please provide exact DX to be used

## 2011-05-21 NOTE — Telephone Encounter (Signed)
Message copied by Maurice Small on Mon May 21, 2011  8:30 AM ------      Message from: Pecola Lawless      Created: Sat May 19, 2011  1:04 PM       This patient will need close supervision for following risk factors: no ongoing follow up here except when brought by his grand parents , very frequent ER visitor.

## 2011-05-21 NOTE — Telephone Encounter (Signed)
Please ask him to consider [facility]  which provides healthcare for  Individuals with limited or no insurance plan coverage. This would significantly decrease healthcare expenses & improve continuity of care  for him

## 2011-05-21 NOTE — Telephone Encounter (Signed)
Left message on voicemail for patient to contact [facility] [phone] or [phone], I called alternate number and spoke with patient's grandmother and she states patient is a hypochondriac and any little thing will prompt him to go. Patient went to the ER over the weekend rash. Patient is seeing psych for anxiety Lorain Childes)   Dr.Hopper informed

## 2011-05-21 NOTE — ED Provider Notes (Signed)
Medical screening examination/treatment/procedure(s) were performed by non-physician practitioner and as supervising physician I was immediately available for consultation/collaboration.    Jaimey Franchini L Dawit Tankard, MD 05/21/11 1054 

## 2011-05-28 ENCOUNTER — Emergency Department (HOSPITAL_COMMUNITY)
Admission: EM | Admit: 2011-05-28 | Discharge: 2011-05-28 | Disposition: A | Discharge status: Home / Self Care | Payer: Self-pay | Attending: Emergency Medicine | Admitting: Emergency Medicine

## 2011-05-28 ENCOUNTER — Encounter (HOSPITAL_COMMUNITY): Payer: Self-pay | Admitting: *Deleted

## 2011-05-28 DIAGNOSIS — M545 Low back pain, unspecified: Secondary | ICD-10-CM | POA: Insufficient documentation

## 2011-05-28 DIAGNOSIS — B1920 Unspecified viral hepatitis C without hepatic coma: Secondary | ICD-10-CM | POA: Insufficient documentation

## 2011-05-28 DIAGNOSIS — F411 Generalized anxiety disorder: Secondary | ICD-10-CM | POA: Insufficient documentation

## 2011-05-28 DIAGNOSIS — G8929 Other chronic pain: Secondary | ICD-10-CM | POA: Insufficient documentation

## 2011-05-28 DIAGNOSIS — F17200 Nicotine dependence, unspecified, uncomplicated: Secondary | ICD-10-CM | POA: Insufficient documentation

## 2011-05-28 HISTORY — DX: Neoplasm related pain (acute) (chronic): G89.3

## 2011-05-28 MED ORDER — KETOROLAC TROMETHAMINE 60 MG/2ML IM SOLN
60.0000 mg | Freq: Once | INTRAMUSCULAR | Status: AC
  Administered 2011-05-28: 60 mg via INTRAMUSCULAR
  Filled 2011-05-28: qty 2

## 2011-05-28 NOTE — ED Notes (Signed)
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  Pain with any movement.  States changes in bm for last 3 weeks.
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
## 2011-05-28 NOTE — Discharge Instructions (Signed)
Back Pain, Adult Low back pain is very common. About 1 in 5 people have back pain.The cause of low back pain is rarely dangerous. The pain often gets better over time.About half of people with a sudden onset of back pain feel better in just 2 weeks. About 8 in 10 people feel better by 6 weeks.  CAUSES Some common causes of back pain include:  Strain of the muscles or ligaments supporting the spine.   Wear and tear (degeneration) of the spinal discs.   Arthritis.   Direct injury to the back.  DIAGNOSIS Most of the time, the direct cause of low back pain is not known.However, back pain can be treated effectively even when the exact cause of the pain is unknown.Answering your caregiver's questions about your overall health and symptoms is one of the most accurate ways to make sure the cause of your pain is not dangerous. If your caregiver needs more information, he or she may order lab work or imaging tests (X-rays or MRIs).However, even if imaging tests show changes in your back, this usually does not require surgery. HOME CARE INSTRUCTIONS For many people, back pain returns.Since low back pain is rarely dangerous, it is often a condition that people can learn to manageon their own.   Remain active. It is stressful on the back to sit or stand in one place. Do not sit, drive, or stand in one place for more than 30 minutes at a time. Take short walks on level surfaces as soon as pain allows.Try to increase the length of time you walk each day.   Do not stay in bed.Resting more than 1 or 2 days can delay your recovery.   Do not avoid exercise or work.Your body is made to move.It is not dangerous to be active, even though your back may hurt.Your back will likely heal faster if you return to being active before your pain is gone.   Pay attention to your body when you bend and lift. Many people have less discomfortwhen lifting if they bend their knees, keep the load close to their  bodies,and avoid twisting. Often, the most comfortable positions are those that put less stress on your recovering back.   Find a comfortable position to sleep. Use a firm mattress and lie on your side with your knees slightly bent. If you lie on your back, put a pillow under your knees.   Only take over-the-counter or prescription medicines as directed by your caregiver. Over-the-counter medicines to reduce pain and inflammation are often the most helpful.Your caregiver may prescribe muscle relaxant drugs.These medicines help dull your pain so you can more quickly return to your normal activities and healthy exercise.   Put ice on the injured area.   Put ice in a plastic bag.   Place a towel between your skin and the bag.   Leave the ice on for 15 to 20 minutes, 3 to 4 times a day for the first 2 to 3 days. After that, ice and heat may be alternated to reduce pain and spasms.   Ask your caregiver about trying back exercises and gentle massage. This may be of some benefit.   Avoid feeling anxious or stressed.Stress increases muscle tension and can worsen back pain.It is important to recognize when you are anxious or stressed and learn ways to manage it.Exercise is a great option.  SEEK MEDICAL CARE IF:  You have pain that is not relieved with rest or medicine.   You have   pain that does not improve in 1 week.   You have new symptoms.   You are generally not feeling well.  SEEK IMMEDIATE MEDICAL CARE IF:   You have pain that radiates from your back into your legs.   You develop new bowel or bladder control problems.   You have unusual weakness or numbness in your arms or legs.   You develop nausea or vomiting.   You develop abdominal pain.   You feel faint.  Document Released: 01/15/2005 Document Revised: 01/04/2011 Document Reviewed: 06/05/2010 ExitCare Patient Information 2012 [employer].     RESOURCE GUIDE  Dental Problems  Patients with  Medicaid: [facility]                     [facility] [address]                                           [address] Phone:  [phone]                                                  Phone:  [phone]  If unable to pay or uninsured, contact:  [facility] or [facility] to become qualified for the adult dental clinic.  Chronic Pain Problems Contact [facility]  [phone] Patients need to be referred by their primary care doctor.  Insufficient Money for Medicine Contact [employer]:  call "211" or [facility] 271-5999.  No Primary Care Doctor Call [facility]  832-8000 Other agencies that provide inexpensive medical care    [facility]  [phone]    [facility]  [phone]    [facility]  [phone]    [facility] Clinic  [phone]    [facility]  [phone]    [facility]  [phone]  Psychological Services [facility]  [phone] [facility]  [phone] [facility]   800 853-5163 (emergency services 641-4993)  Substance Abuse Resources [facility]  336-882-2125 [facility] 336-784-9470 [facility] 336-285-9073 [facility] 336-845-3988 Residential & Outpatient Substance Abuse Program  800-659-3381  Abuse/Neglect [place] Child Abuse Hotline (336) 641-3795 [place] Child Abuse Hotline 800-378-5315 (After Hours)  Emergency Shelter [facility] (336) 271-5985  [facility] (336) 275-9566 [facility] (704) 372-4663  MRSA Hotline #:   832-7006    [place] Resources  [facility] [place]     [employer]                          [facility] [address] [place]                       [address]      [address]  [place]                                                   [place]                            [place] Phone:  [phone]                                   Phone:  [phone]                 Phone:  [phone]  [facility] Phone:  [phone]  [place] Child Abuse Hotline (336) 342-1394 (336) 342-3537 (After Hours)   

## 2011-06-09 ENCOUNTER — Emergency Department (HOSPITAL_COMMUNITY)
Admission: EM | Admit: 2011-06-09 | Discharge: 2011-06-09 | Disposition: A | Discharge status: Home / Self Care | Payer: Self-pay | Attending: Emergency Medicine | Admitting: Emergency Medicine

## 2011-06-09 ENCOUNTER — Emergency Department (HOSPITAL_COMMUNITY): Payer: Self-pay

## 2011-06-09 ENCOUNTER — Encounter (HOSPITAL_COMMUNITY): Payer: Self-pay

## 2011-06-09 DIAGNOSIS — F17200 Nicotine dependence, unspecified, uncomplicated: Secondary | ICD-10-CM | POA: Insufficient documentation

## 2011-06-09 DIAGNOSIS — I498 Other specified cardiac arrhythmias: Secondary | ICD-10-CM | POA: Insufficient documentation

## 2011-06-09 DIAGNOSIS — K089 Disorder of teeth and supporting structures, unspecified: Secondary | ICD-10-CM | POA: Insufficient documentation

## 2011-06-09 DIAGNOSIS — R0602 Shortness of breath: Secondary | ICD-10-CM | POA: Insufficient documentation

## 2011-06-09 DIAGNOSIS — R071 Chest pain on breathing: Secondary | ICD-10-CM | POA: Insufficient documentation

## 2011-06-09 DIAGNOSIS — F329 Major depressive disorder, single episode, unspecified: Secondary | ICD-10-CM | POA: Insufficient documentation

## 2011-06-09 DIAGNOSIS — F3289 Other specified depressive episodes: Secondary | ICD-10-CM | POA: Insufficient documentation

## 2011-06-09 DIAGNOSIS — K0889 Other specified disorders of teeth and supporting structures: Secondary | ICD-10-CM

## 2011-06-09 LAB — CBC
HCT: 43.2 % (ref 39.0–52.0)
Hemoglobin: 15.1 g/dL (ref 13.0–17.0)
MCH: 31.1 pg (ref 26.0–34.0)
MCHC: 35 g/dL (ref 30.0–36.0)
MCV: 89.1 fL (ref 78.0–100.0)
Platelets: 244 10*3/uL (ref 150–400)
RBC: 4.85 MIL/uL (ref 4.22–5.81)
RDW: 14.2 % (ref 11.5–15.5)
WBC: 10.6 10*3/uL — ABNORMAL HIGH (ref 4.0–10.5)

## 2011-06-09 LAB — BASIC METABOLIC PANEL
BUN: 15 mg/dL (ref 6–23)
CO2: 25 mEq/L (ref 19–32)
Calcium: 9.1 mg/dL (ref 8.4–10.5)
Chloride: 102 mEq/L (ref 96–112)
Creatinine, Ser: 0.77 mg/dL (ref 0.50–1.35)
GFR calc Af Amer: >90 mL/min (ref >90)
GFR calc non Af Amer: >90 mL/min (ref >90)
Glucose, Bld: 117 mg/dL — ABNORMAL HIGH (ref 70–99)
Potassium: 4 mEq/L (ref 3.5–5.1)
Sodium: 136 mEq/L (ref 135–145)

## 2011-06-09 LAB — TROPONIN I: Troponin I: <0.3 ng/mL (ref <0.30)

## 2011-06-09 LAB — D-DIMER, QUANTITATIVE: D-Dimer, Quant: 3.03 ug/mL-FEU — ABNORMAL HIGH (ref 0.00–0.48)

## 2011-06-09 MED ORDER — BUPIVACAINE HCL (PF) 0.5 % IJ SOLN
10.0000 mL | Freq: Once | INTRAMUSCULAR | Status: DC
  Filled 2011-06-09: qty 30

## 2011-06-09 MED ORDER — SODIUM CHLORIDE 0.9 % IV BOLUS (SEPSIS)
1000.0000 mL | Freq: Once | INTRAVENOUS | Status: AC
  Administered 2011-06-09: 1000 mL via INTRAVENOUS

## 2011-06-09 MED ORDER — KETOROLAC TROMETHAMINE 15 MG/ML IJ SOLN
15.0000 mg | Freq: Once | INTRAMUSCULAR | Status: AC
  Administered 2011-06-09: 15 mg via INTRAVENOUS
  Filled 2011-06-09: qty 1

## 2011-06-09 MED ORDER — IOHEXOL 300 MG/ML  SOLN
100.0000 mL | Freq: Once | INTRAMUSCULAR | Status: AC | PRN
  Administered 2011-06-09: 100 mL via INTRAVENOUS
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
## 2011-06-09 NOTE — Discharge Instructions (Signed)
Chest Pain (Nonspecific) Chest pain has many causes. Your pain could be caused by something serious, such as a heart attack or a blood clot in the lungs. It could also be caused by something less serious, such as a chest bruise or a virus. Follow up with your doctor. More lab tests or other studies may be needed to find the cause of your pain. Most of the time, nonspecific chest pain will improve within 2 to 3 days of rest and mild pain medicine. HOME CARE  For chest bruises, you may put ice on the sore area for 15 to 20 minutes, 3 to 4 times a day. Do this only if it makes you or your child feel better.   Put ice in a plastic bag.   Place a towel between the skin and the bag.   Rest for the next 2 to 3 days.   Go back to work if the pain improves.   See your doctor if the pain lasts longer than 1 to 2 weeks.   Only take medicine as told by your doctor.   Quit smoking if you smoke.  GET HELP RIGHT AWAY IF:   There is more pain or pain that spreads to the arm, neck, jaw, back, or belly (abdomen).   You or your child has shortness of breath.   You or your child coughs more than usual or coughs up blood.   You or your child has very bad back or belly pain, feels sick to his or her stomach (nauseous), or throws up (vomits).   You or your child has very bad weakness.   You or your child passes out (faints).   You or your child has a temperature by mouth above 102 F (38.9 C), not controlled by medicine.  Any of these problems may be serious and may be an emergency. Do not wait to see if the problems will go away. Get medical help right away. Call your local emergency services 911 in [place] Do not drive yourself to the hospital. MAKE SURE YOU:   Understand these instructions.   Will watch this condition.   Will get help right away if you or your child is not doing well or gets worse.  Document Released: 07/04/2007 Document Revised: 01/04/2011 Document Reviewed:  07/04/2007 [facility] Patient Information 2012 [place], [place].Dental Pain A tooth ache may be caused by cavities (tooth decay). Cavities expose the nerve of the tooth to air and hot or cold temperatures. It may come from an infection or abscess (also called a boil or furuncle) around your tooth. It is also often caused by dental caries (tooth decay). This causes the pain you are having. DIAGNOSIS  Your caregiver can diagnose this problem by exam. TREATMENT   If caused by an infection, it may be treated with medications which kill germs (antibiotics) and pain medications as prescribed by your caregiver. Take medications as directed.   Only take over-the-counter or prescription medicines for pain, discomfort, or fever as directed by your caregiver.   Whether the tooth ache today is caused by infection or dental disease, you should see your dentist as soon as possible for further care.  SEEK MEDICAL CARE IF: The exam and treatment you received today has been provided on an emergency basis only. This is not a substitute for complete medical or dental care. If your problem worsens or new problems (symptoms) appear, and you are unable to meet with your dentist, call or return to this location. SEEK  IMMEDIATE MEDICAL CARE IF:   You have a fever.   You develop redness and swelling of your face, jaw, or neck.   You are unable to open your mouth.   You have severe pain uncontrolled by pain medicine.  MAKE SURE YOU:   Understand these instructions.   Will watch your condition.   Will get help right away if you are not doing well or get worse.  Document Released: 01/15/2005 Document Revised: 01/04/2011 Document Reviewed: 09/03/2007 [facility] Patient Information 2012 [place], [place].Dental Pain A tooth ache may be caused by cavities (tooth decay). Cavities expose the nerve of the tooth to air and hot or cold temperatures. It may come from an infection or abscess (also called a boil or furuncle)  around your tooth. It is also often caused by dental caries (tooth decay). This causes the pain you are having. DIAGNOSIS  Your caregiver can diagnose this problem by exam. TREATMENT   If caused by an infection, it may be treated with medications which kill germs (antibiotics) and pain medications as prescribed by your caregiver. Take medications as directed.   Only take over-the-counter or prescription medicines for pain, discomfort, or fever as directed by your caregiver.   Whether the tooth ache today is caused by infection or dental disease, you should see your dentist as soon as possible for further care.  SEEK MEDICAL CARE IF: The exam and treatment you received today has been provided on an emergency basis only. This is not a substitute for complete medical or dental care. If your problem worsens or new problems (symptoms) appear, and you are unable to meet with your dentist, call or return to this location. SEEK IMMEDIATE MEDICAL CARE IF:   You have a fever.   You develop redness and swelling of your face, jaw, or neck.   You are unable to open your mouth.   You have severe pain uncontrolled by pain medicine.  MAKE SURE YOU:   Understand these instructions.   Will watch your condition.   Will get help right away if you are not doing well or get worse.  Document Released: 01/15/2005 Document Revised: 01/04/2011 Document Reviewed: 09/03/2007 [facility] Patient Information 2012 [place], [place].  RESOURCE GUIDE  Dental Problems  Patients with Medicaid: [facility] [phone] [address]                                           [phone] [address] Phone:  [phone]                                                  Phone:  [phone]  If unable to pay or uninsured, contact:  [facility] or [facility] to become qualified for the adult dental clinic.  Chronic Pain Problems Contact [facility]  [phone] Patients need to be referred by their primary care doctor.  Insufficient Money for Medicine Contact [employer]:  call "211" or [facility] (904)822-1687.  No Primary Care Doctor Call [facility]  918-093-0042 Other agencies that provide inexpensive medical care  [facility]  [phone]    [facility]  [phone]    [facility]  [phone]    [facility]  [phone]    [facility]  [phone]    [facility]  [phone]  Psychological Services [facility]  [phone] [facility]  [phone] [facility]   [phone] (emergency services 817-664-4173)  Substance Abuse Resources [facility]  425-373-4813 [facility] 249-053-3877 [facility] 6064517684 Floydene Flock (608) [phone] Residential & Outpatient Substance Abuse Program  [phone]  Abuse/Neglect [facility] Child Abuse Hotline 616-047-9774 [facility] Child Abuse Hotline 301-329-8580 (After Hours)  Emergency Shelter [facility] (410)839-2704  Maternity Homes Room at the [place] [facility] [place] 985-669-4671 [facility] (667) [phone]  MRSA Hotline #:   617-736-3765    [facility] Resources  [facility] [place]     [employer]                          [facility] Dept. [address] [place]                       [address]      [address]  Blondell Reveal Phone:  [phone]                                   Phone:  [phone]                 Phone:  [phone]  [facility] Mental [facility] Phone:  [phone]  [facility] Child Abuse Hotline (253) [phone] (After Hours)

## 2011-06-09 NOTE — ED Notes (Signed)
Pt in from home states sob with chest pain states pain onset last night with no relief today pt states pain is in the center of chest worsens on deep inspiration states dizziness denies n/v pt also c/o tooth pain states dental pain is right, back, lower tooth

## 2011-06-09 NOTE — ED Notes (Signed)
[id number]<AV> Expected date:<BR> Expected time:<BR> Means of arrival:<BR> Comments:<BR> Hold for room 7

## 2011-06-09 NOTE — ED Notes (Signed)
EDP Kohut made aware of pt c/o of chest pain greater with inhalation and exhalation.  Chronic pain 3/10

## 2011-06-30 ENCOUNTER — Emergency Department (INDEPENDENT_AMBULATORY_CARE_PROVIDER_SITE_OTHER)
Admission: EM | Admit: 2011-06-30 | Discharge: 2011-06-30 | Disposition: A | Discharge status: Home / Self Care | Payer: Self-pay | Source: Home / Self Care | Attending: Emergency Medicine | Admitting: Emergency Medicine

## 2011-06-30 ENCOUNTER — Encounter (HOSPITAL_COMMUNITY): Payer: Self-pay

## 2011-06-30 ENCOUNTER — Emergency Department (INDEPENDENT_AMBULATORY_CARE_PROVIDER_SITE_OTHER): Payer: No Typology Code available for payment source

## 2011-06-30 DIAGNOSIS — S92309A Fracture of unspecified metatarsal bone(s), unspecified foot, initial encounter for closed fracture: Secondary | ICD-10-CM

## 2011-06-30 MED ORDER — HYDROCODONE-ACETAMINOPHEN 5-325 MG PO TABS
1.0000 | ORAL_TABLET | Freq: Once | ORAL | Status: AC
  Administered 2011-06-30: 1 via ORAL

## 2011-06-30 MED ORDER — HYDROCODONE-ACETAMINOPHEN 5-325 MG PO TABS
ORAL_TABLET | ORAL | Status: AC
  Filled 2011-06-30: qty 1

## 2011-06-30 MED ORDER — OXYCODONE-ACETAMINOPHEN 5-325 MG PO TABS: ORAL_TABLET | ORAL | Status: AC

## 2011-06-30 MED ORDER — OXYCODONE-ACETAMINOPHEN 5-325 MG PO TABS: ORAL_TABLET | ORAL | Status: DC

## 2011-06-30 MED ORDER — OXYCODONE-ACETAMINOPHEN 5-325 MG PO TABS: 1.0000 | ORAL_TABLET | Freq: Once | ORAL | Status: DC

## 2011-06-30 NOTE — ED Notes (Signed)
Pt rolled rt foot this am and has swelling and pain with ambulation, foot bruised and edematous.

## 2011-06-30 NOTE — ED Notes (Signed)
Orthopedic Tech. paged @ 13:10; returned call @ 13:11 for application of posterior tibia splint to patient's LRE.

## 2011-06-30 NOTE — Discharge Instructions (Signed)
Cast or Splint Care  Casts and splints support injured limbs and keep bones from moving while they heal.   HOME CARE   Keep the cast or splint uncovered during the drying period.   A plaster cast can take 24 to 48 hours to dry.   A fiberglass cast will dry in less than 1 hour.   Do not rest the cast on anything harder than a pillow for 24 hours.   Do not put weight on your injured limb. Do not put pressure on the cast. Wait for your doctor's approval.   Keep the cast or splint dry.   Cover the cast or splint with a plastic bag during baths or wet weather.   If you have a cast over your chest and belly (trunk), take sponge baths until the cast is taken off.   Keep your cast or splint clean. Wash a dirty cast with a damp cloth.   Do not put any objects under your cast or splint. Do not scratch the skin under the cast with an object.   Do not take out the padding from inside your cast.   Exercise your joints near the cast as told by your doctor.   Raise (elevate) your injured limb on 1 or 2 pillows for the first 1 to 3 days.  GET HELP RIGHT AWAY IF:   Your cast or splint cracks.   Your cast or splint is too tight or too loose.   You itch badly under the cast.   Your cast gets wet or has a soft spot.   You have a bad smell coming from the cast.   You get an object stuck under the cast.   Your skin around the cast becomes red or raw.   You have new or more pain after the cast is put on.   You have fluid leaking through the cast.   You cannot move your fingers or toes.   Your fingers or toes turn colors or are cool, painful, or puffy (swollen).   You have tingling or lose feeling (numbness) around the injured area.   You have pain or pressure under the cast.   You have trouble breathing or have shortness of breath.   You have chest pain.  MAKE SURE YOU:   Understand these instructions.   Will watch your condition.   Will get help right away if you are not doing well or get worse.  Document  Released: 05/17/2010 Document Revised: 01/04/2011 Document Reviewed: 05/17/2010  [employer] Patient Information 2012 [employer].

## 2011-06-30 NOTE — ED Provider Notes (Signed)
Chief Complaint  Patient presents with  . Foot Injury    History of Present Illness:   Lawrence [name] rolled his foot at 9 AM this morning at a church youth group function. Ever since then he's had pain over the lateral foot, localized mostly to the base of the fifth metatarsal. There is some swelling and discoloration there. It hurts to touch and is barely able to walk. No numbness or tingling.  Review of Systems:  Other than noted above, the patient denies any of the following symptoms: Systemic:  No fevers, chills, sweats, or aches.  No fatigue or tiredness. Musculoskeletal:  No joint pain, arthritis, bursitis, swelling, back pain, or neck pain. Neurological:  No muscular weakness, paresthesias, headache, or trouble with speech or coordination.  No dizziness.   PMFSH:  Past medical history, family history, social history, meds, and allergies were reviewed.  Physical Exam:   Vital signs:  BP 136/76  Pulse 110  Temp(Src) 98.7 F (37.1 C) (Oral)  Resp 20  SpO2 100% Gen:  Alert and oriented times 3.  In no distress. Musculoskeletal: There was swelling, bruising, and pain to palpation over the entire fifth metatarsal, most markedly at the base. He is able to move all his toes well. Pedal pulses are full. Sensation is intact. Capillary refill is normal. There's no pain to palpation of the ankle or over the first metatarsal. Otherwise, all joints had a full a ROM with no swelling, bruising or deformity.  No edema, pulses full. Extremities were warm and pink.  Capillary refill was brisk.  Skin:  Clear, warm and dry.  No rash. Neuro:  Alert and oriented times 3.  Muscle strength was normal.  Sensation was intact to light touch.   Radiology:  Dg Foot Complete Right  06/30/2011  *RADIOLOGY REPORT*  Clinical Data: Pain, soft tissue swelling laterally.  Injury.  RIGHT FOOT COMPLETE - 3+ VIEW  Comparison: None.  Findings: There is a mildly displaced fracture through the base of the right fifth metatarsal.   Fracture is mildly comminuted. Overlying soft tissue swelling.  IMPRESSION: Mildly comminuted and displaced fracture through the base of the right fifth metatarsal.  Original Report Authenticated By: Cyndie Chime, M.D.   Course in Urgent Care Center:   The patient was placed in a posterior short leg splint and given crutches for ambulation.  Assessment:  The encounter diagnosis was Fracture of metatarsal.  Plan:   1.  The following meds were prescribed:   New Prescriptions   OXYCODONE-ACETAMINOPHEN (PERCOCET) 5-325 MG PER TABLET    1 to 2 tablets every 6 hours as needed for pain.   2.  The patient was instructed in symptomatic care, including rest and activity, elevation, application of ice and compression.  Appropriate handouts were given. 3.  The patient was told to return if becoming worse in any way, if no better in 3 or 4 days, and given some red flag symptoms that would indicate earlier return.   4.  The patient was told to follow up with Dr. Francena Hanly early next week.   Reuben Likes, MD 06/30/11 [id number]

## 2011-06-30 NOTE — Progress Notes (Signed)
Orthopedic Tech Progress Note Patient Details:  Lawrence [name] 08-26-1985 [id number]  Ortho Devices Type of Ortho Device: Short leg splint Ortho Device/Splint Location: (R) LE Ortho Device/Splint Interventions: Application   Jennye Moccasin 06/30/2011, 1:34 PM

## 2011-07-01 ENCOUNTER — Encounter (HOSPITAL_COMMUNITY): Payer: Self-pay | Admitting: *Deleted

## 2011-07-01 ENCOUNTER — Emergency Department (HOSPITAL_COMMUNITY)
Admission: EM | Admit: 2011-07-01 | Discharge: 2011-07-01 | Disposition: A | Discharge status: Home / Self Care | Payer: Self-pay | Attending: Emergency Medicine | Admitting: Emergency Medicine

## 2011-07-01 ENCOUNTER — Emergency Department (HOSPITAL_COMMUNITY): Payer: Self-pay

## 2011-07-01 DIAGNOSIS — Y92009 Unspecified place in unspecified non-institutional (private) residence as the place of occurrence of the external cause: Secondary | ICD-10-CM | POA: Insufficient documentation

## 2011-07-01 DIAGNOSIS — B1920 Unspecified viral hepatitis C without hepatic coma: Secondary | ICD-10-CM | POA: Insufficient documentation

## 2011-07-01 DIAGNOSIS — F329 Major depressive disorder, single episode, unspecified: Secondary | ICD-10-CM | POA: Insufficient documentation

## 2011-07-01 DIAGNOSIS — S92309A Fracture of unspecified metatarsal bone(s), unspecified foot, initial encounter for closed fracture: Secondary | ICD-10-CM | POA: Insufficient documentation

## 2011-07-01 DIAGNOSIS — Y998 Other external cause status: Secondary | ICD-10-CM | POA: Insufficient documentation

## 2011-07-01 DIAGNOSIS — F411 Generalized anxiety disorder: Secondary | ICD-10-CM | POA: Insufficient documentation

## 2011-07-01 DIAGNOSIS — X500XXA Overexertion from strenuous movement or load, initial encounter: Secondary | ICD-10-CM | POA: Insufficient documentation

## 2011-07-01 DIAGNOSIS — F3289 Other specified depressive episodes: Secondary | ICD-10-CM | POA: Insufficient documentation

## 2011-07-01 MED ORDER — KETOROLAC TROMETHAMINE 60 MG/2ML IM SOLN
60.0000 mg | Freq: Once | INTRAMUSCULAR | Status: AC
  Administered 2011-07-01: 60 mg via INTRAMUSCULAR
  Filled 2011-07-01: qty 2

## 2011-07-01 MED ORDER — HYDROMORPHONE HCL PF 1 MG/ML IJ SOLN
1.0000 mg | Freq: Once | INTRAMUSCULAR | Status: AC
  Administered 2011-07-01: 1 mg via INTRAMUSCULAR
  Filled 2011-07-01: qty 1

## 2011-07-01 NOTE — ED Notes (Signed)
Pt seen at urgent care yesterday due to foot injury, broken rt foot without relief from pain and increased swelling, can feel toe tactical stimulation and able to wiggle some, has bruising and swelling present, states it feels like his foot is going to explode
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
## 2011-07-01 NOTE — ED Notes (Signed)
Ortho tech at bedside to apply splint.  

## 2011-07-01 NOTE — Discharge Instructions (Signed)
Cast or Splint Care  Casts and splints support injured limbs and keep bones from moving while they heal.   HOME CARE   Keep the cast or splint uncovered during the drying period.   A plaster cast can take 24 to 48 hours to dry.   A fiberglass cast will dry in less than 1 hour.   Do not rest the cast on anything harder than a pillow for 24 hours.   Do not put weight on your injured limb. Do not put pressure on the cast. Wait for your doctor's approval.   Keep the cast or splint dry.   Cover the cast or splint with a plastic bag during baths or wet weather.   If you have a cast over your chest and belly (trunk), take sponge baths until the cast is taken off.   Keep your cast or splint clean. Wash a dirty cast with a damp cloth.   Do not put any objects under your cast or splint. Do not scratch the skin under the cast with an object.   Do not take out the padding from inside your cast.   Exercise your joints near the cast as told by your doctor.   Raise (elevate) your injured limb on 1 or 2 pillows for the first 1 to 3 days.  GET HELP RIGHT AWAY IF:   Your cast or splint cracks.   Your cast or splint is too tight or too loose.   You itch badly under the cast.   Your cast gets wet or has a soft spot.   You have a bad smell coming from the cast.   You get an object stuck under the cast.   Your skin around the cast becomes red or raw.   You have new or more pain after the cast is put on.   You have fluid leaking through the cast.   You cannot move your fingers or toes.   Your fingers or toes turn colors or are cool, painful, or puffy (swollen).   You have tingling or lose feeling (numbness) around the injured area.   You have pain or pressure under the cast.   You have trouble breathing or have shortness of breath.   You have chest pain.  MAKE SURE YOU:   Understand these instructions.   Will watch your condition.   Will get help right away if you are not doing well or get worse.  Document  Released: 05/17/2010 Document Revised: 01/04/2011 Document Reviewed: 05/17/2010  [employer] Patient Information 2012 [employer].

## 2011-07-01 NOTE — ED Notes (Signed)
Ortho tech called to apply splint. °

## 2011-07-02 NOTE — ED Provider Notes (Signed)
Medical screening examination/treatment/procedure(s) were performed by non-physician practitioner and as supervising physician I was immediately available for consultation/collaboration.  Geoffery Lyons, MD 07/02/11 [phone]

## 2011-08-17 ENCOUNTER — Emergency Department (HOSPITAL_COMMUNITY)
Admission: EM | Admit: 2011-08-17 | Discharge: 2011-08-17 | Disposition: A | Discharge status: Home / Self Care | Payer: Self-pay | Attending: Emergency Medicine | Admitting: Emergency Medicine

## 2011-08-17 ENCOUNTER — Encounter (HOSPITAL_COMMUNITY): Payer: Self-pay | Admitting: *Deleted

## 2011-08-17 ENCOUNTER — Emergency Department (INDEPENDENT_AMBULATORY_CARE_PROVIDER_SITE_OTHER)
Admission: EM | Admit: 2011-08-17 | Discharge: 2011-08-17 | Disposition: A | Discharge status: Nursing Home (Includes ICF) | Payer: Self-pay | Source: Home / Self Care | Attending: Emergency Medicine | Admitting: Emergency Medicine

## 2011-08-17 ENCOUNTER — Emergency Department (HOSPITAL_COMMUNITY)
Admission: EM | Admit: 2011-08-17 | Discharge: 2011-08-18 | Disposition: A | Discharge status: Home / Self Care | Payer: Self-pay | Attending: Emergency Medicine | Admitting: Emergency Medicine

## 2011-08-17 DIAGNOSIS — R112 Nausea with vomiting, unspecified: Secondary | ICD-10-CM | POA: Insufficient documentation

## 2011-08-17 DIAGNOSIS — R109 Unspecified abdominal pain: Secondary | ICD-10-CM

## 2011-08-17 DIAGNOSIS — F411 Generalized anxiety disorder: Secondary | ICD-10-CM | POA: Insufficient documentation

## 2011-08-17 DIAGNOSIS — B1920 Unspecified viral hepatitis C without hepatic coma: Secondary | ICD-10-CM | POA: Insufficient documentation

## 2011-08-17 DIAGNOSIS — Z87442 Personal history of urinary calculi: Secondary | ICD-10-CM | POA: Insufficient documentation

## 2011-08-17 DIAGNOSIS — F3289 Other specified depressive episodes: Secondary | ICD-10-CM | POA: Insufficient documentation

## 2011-08-17 DIAGNOSIS — F329 Major depressive disorder, single episode, unspecified: Secondary | ICD-10-CM | POA: Insufficient documentation

## 2011-08-17 DIAGNOSIS — F17200 Nicotine dependence, unspecified, uncomplicated: Secondary | ICD-10-CM | POA: Insufficient documentation

## 2011-08-17 DIAGNOSIS — K759 Inflammatory liver disease, unspecified: Secondary | ICD-10-CM

## 2011-08-17 HISTORY — DX: Disorder of kidney and ureter, unspecified: N28.9

## 2011-08-17 LAB — CBC WITH DIFFERENTIAL/PLATELET
Basophils Absolute: 0.1 10*3/uL (ref 0.0–0.1)
Basophils Relative: 1 % (ref 0–1)
Eosinophils Absolute: 0.1 10*3/uL (ref 0.0–0.7)
Hemoglobin: 14.9 g/dL (ref 13.0–17.0)
MCH: 31.7 pg (ref 26.0–34.0)
MCHC: 35.2 g/dL (ref 30.0–36.0)
Monocytes Absolute: 0.7 10*3/uL (ref 0.1–1.0)
Monocytes Relative: 9 % (ref 3–12)
Neutro Abs: 4.2 10*3/uL (ref 1.7–7.7)
Neutrophils Relative %: 54 % (ref 43–77)
RDW: 14.2 % (ref 11.5–15.5)

## 2011-08-17 LAB — URINALYSIS, ROUTINE W REFLEX MICROSCOPIC
Bilirubin Urine: NEGATIVE
Ketones, ur: NEGATIVE mg/dL
Leukocytes, UA: NEGATIVE
Nitrite: NEGATIVE
Protein, ur: NEGATIVE mg/dL
pH: 7.5 (ref 5.0–8.0)

## 2011-08-17 LAB — COMPREHENSIVE METABOLIC PANEL
ALT: 262 U/L — ABNORMAL HIGH (ref 0–53)
AST: 125 U/L — ABNORMAL HIGH (ref 0–37)
CO2: 27 mEq/L (ref 19–32)
Calcium: 9.8 mg/dL (ref 8.4–10.5)
Chloride: 103 mEq/L (ref 96–112)
GFR calc non Af Amer: >90 mL/min (ref >90)
Sodium: 139 mEq/L (ref 135–145)

## 2011-08-17 MED ORDER — HYDROMORPHONE HCL PF 2 MG/ML IJ SOLN
2.0000 mg | Freq: Once | INTRAMUSCULAR | Status: AC
  Administered 2011-08-17: 2 mg via INTRAMUSCULAR
  Filled 2011-08-17: qty 1

## 2011-08-17 NOTE — ED Notes (Signed)
Patient with left flank pain, radiating into his back and into LLQ.  Patient has pain upon palpation of LLQ.  No nausea or vomiting at this time.  Patient states that he has been from here to [facility] and now back to here for evaluation.  Patient states that he is having pain in his liver and his enzymes are elevated.

## 2011-08-17 NOTE — ED Notes (Signed)
Unsure  Why  Pt  Presented from  The  [facility]  From the  Er  Where  He  Was  Just  At   -  In  Any  Event  Dr  Ladon Applebaum  Examined  Pt    And     The  Pt  Will  Be  Transferred  Back to er  For  furthur  evaul

## 2011-08-17 NOTE — ED Notes (Signed)
The pt was here ealrlier today with lt flank pain .  He left here and went to [facility] and was sent c=back  Due to elevated liver enzymes.  He still has pain

## 2011-08-17 NOTE — ED Notes (Signed)
Pt  Reports  Low   abd  Pain        Mainly           l  Side  Pt  Reports  He  Also    States  Some  Low back  Pain  And  Burning  When  He  Urinates        Pt  States  It  Also  Burns  As  Well  When  He  Urinates    Pt  Was    In  Er   Prior to  This  Visit      Pt  Had  Lab  Work  And  urine  Test  Done  In the  Er  -  Pt    States has  Had  History  Of  Kidney  Stones  In past     He  Reports         The  abd pain  Started     Yesterday
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
## 2011-08-17 NOTE — ED Notes (Signed)
Pt is at [facility] to be seen.

## 2011-08-17 NOTE — ED Notes (Addendum)
Pt c/o rt flank pain with nv  Since yesterday and he has had kidney stone in the past
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
## 2011-10-14 ENCOUNTER — Encounter (HOSPITAL_COMMUNITY): Payer: Self-pay | Admitting: *Deleted

## 2011-10-14 ENCOUNTER — Emergency Department (INDEPENDENT_AMBULATORY_CARE_PROVIDER_SITE_OTHER)
Admission: EM | Admit: 2011-10-14 | Discharge: 2011-10-14 | Disposition: A | Discharge status: Home / Self Care | Payer: Self-pay | Source: Home / Self Care | Attending: Emergency Medicine | Admitting: Emergency Medicine

## 2011-10-14 DIAGNOSIS — K089 Disorder of teeth and supporting structures, unspecified: Secondary | ICD-10-CM

## 2011-10-14 DIAGNOSIS — K0889 Other specified disorders of teeth and supporting structures: Secondary | ICD-10-CM

## 2011-10-14 MED ORDER — KETOROLAC TROMETHAMINE 10 MG PO TABS: 10.0000 mg | ORAL_TABLET | Freq: Four times a day (QID) | ORAL | Status: AC | PRN

## 2011-10-14 MED ORDER — PENICILLIN V POTASSIUM 500 MG PO TABS: 500.0000 mg | ORAL_TABLET | Freq: Three times a day (TID) | ORAL | Status: AC

## 2011-10-14 NOTE — ED Notes (Signed)
States woke up at 0400 this AM with shooting pains in left upper teeth/gums.  Has tried salt water rinses, ice pack, and Motrin without relief.
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
## 2011-11-28 ENCOUNTER — Encounter (HOSPITAL_COMMUNITY): Payer: Self-pay

## 2011-11-28 ENCOUNTER — Emergency Department (HOSPITAL_COMMUNITY): Payer: Self-pay

## 2011-11-28 ENCOUNTER — Emergency Department (HOSPITAL_COMMUNITY)
Admission: EM | Admit: 2011-11-28 | Discharge: 2011-11-28 | Disposition: A | Discharge status: Home / Self Care | Payer: Self-pay | Attending: Emergency Medicine | Admitting: Emergency Medicine

## 2011-11-28 DIAGNOSIS — R7402 Elevation of levels of lactic acid dehydrogenase (LDH): Secondary | ICD-10-CM | POA: Insufficient documentation

## 2011-11-28 DIAGNOSIS — M549 Dorsalgia, unspecified: Secondary | ICD-10-CM | POA: Insufficient documentation

## 2011-11-28 DIAGNOSIS — G893 Neoplasm related pain (acute) (chronic): Secondary | ICD-10-CM | POA: Insufficient documentation

## 2011-11-28 DIAGNOSIS — Z79899 Other long term (current) drug therapy: Secondary | ICD-10-CM | POA: Insufficient documentation

## 2011-11-28 DIAGNOSIS — R6883 Chills (without fever): Secondary | ICD-10-CM

## 2011-11-28 DIAGNOSIS — R509 Fever, unspecified: Secondary | ICD-10-CM | POA: Insufficient documentation

## 2011-11-28 DIAGNOSIS — F3289 Other specified depressive episodes: Secondary | ICD-10-CM | POA: Insufficient documentation

## 2011-11-28 DIAGNOSIS — R109 Unspecified abdominal pain: Secondary | ICD-10-CM | POA: Insufficient documentation

## 2011-11-28 DIAGNOSIS — F329 Major depressive disorder, single episode, unspecified: Secondary | ICD-10-CM | POA: Insufficient documentation

## 2011-11-28 DIAGNOSIS — B1920 Unspecified viral hepatitis C without hepatic coma: Secondary | ICD-10-CM | POA: Insufficient documentation

## 2011-11-28 DIAGNOSIS — R7401 Elevation of levels of liver transaminase levels: Secondary | ICD-10-CM | POA: Insufficient documentation

## 2011-11-28 DIAGNOSIS — N289 Disorder of kidney and ureter, unspecified: Secondary | ICD-10-CM | POA: Insufficient documentation

## 2011-11-28 DIAGNOSIS — F411 Generalized anxiety disorder: Secondary | ICD-10-CM | POA: Insufficient documentation

## 2011-11-28 DIAGNOSIS — F17200 Nicotine dependence, unspecified, uncomplicated: Secondary | ICD-10-CM | POA: Insufficient documentation

## 2011-11-28 LAB — CBC WITH DIFFERENTIAL/PLATELET
HCT: 43.2 % (ref 39.0–52.0)
Hemoglobin: 15 g/dL (ref 13.0–17.0)
Lymphocytes Relative: 25 % (ref 12–46)
Lymphs Abs: 2.2 10*3/uL (ref 0.7–4.0)
Monocytes Absolute: 0.5 10*3/uL (ref 0.1–1.0)
Monocytes Relative: 5 % (ref 3–12)
Neutro Abs: 6.1 10*3/uL (ref 1.7–7.7)
RBC: 4.92 MIL/uL (ref 4.22–5.81)
WBC: 8.9 10*3/uL (ref 4.0–10.5)

## 2011-11-28 LAB — URINALYSIS, ROUTINE W REFLEX MICROSCOPIC
Bilirubin Urine: NEGATIVE
Glucose, UA: NEGATIVE mg/dL
Hgb urine dipstick: NEGATIVE
Specific Gravity, Urine: 1.009 (ref 1.005–1.030)
Urobilinogen, UA: 0.2 mg/dL (ref 0.0–1.0)

## 2011-11-28 LAB — COMPREHENSIVE METABOLIC PANEL
Alkaline Phosphatase: 84 U/L (ref 39–117)
BUN: 9 mg/dL (ref 6–23)
CO2: 24 mEq/L (ref 19–32)
Chloride: 101 mEq/L (ref 96–112)
Creatinine, Ser: 0.66 mg/dL (ref 0.50–1.35)
GFR calc non Af Amer: >90 mL/min (ref >90)
Potassium: 5.1 mEq/L (ref 3.5–5.1)
Total Bilirubin: 0.5 mg/dL (ref 0.3–1.2)

## 2011-11-28 LAB — OCCULT BLOOD, POC DEVICE: Fecal Occult Bld: NEGATIVE

## 2011-11-28 LAB — LACTIC ACID, PLASMA: Lactic Acid, Venous: 1.4 mmol/L (ref 0.5–2.2)

## 2011-11-28 MED ORDER — IOHEXOL 300 MG/ML  SOLN
100.0000 mL | Freq: Once | INTRAMUSCULAR | Status: AC | PRN
  Administered 2011-11-28: 100 mL via INTRAVENOUS

## 2011-11-28 MED ORDER — HYDROCODONE-ACETAMINOPHEN 5-325 MG PO TABS: 1.0000 | ORAL_TABLET | Freq: Four times a day (QID) | ORAL | Status: DC | PRN

## 2011-11-28 MED ORDER — MORPHINE SULFATE 4 MG/ML IJ SOLN
8.0000 mg | Freq: Once | INTRAMUSCULAR | Status: AC
  Administered 2011-11-28: 8 mg via INTRAVENOUS
  Filled 2011-11-28: qty 2

## 2011-11-28 MED ORDER — ONDANSETRON HCL 4 MG/2ML IJ SOLN
4.0000 mg | INTRAMUSCULAR | Status: DC | PRN
  Administered 2011-11-28: 4 mg via INTRAVENOUS
  Filled 2011-11-28: qty 2

## 2011-11-28 MED ORDER — SODIUM CHLORIDE 0.9 % IV SOLN
Freq: Once | INTRAVENOUS | Status: AC
  Administered 2011-11-28: 15:00:00 via INTRAVENOUS

## 2011-11-28 NOTE — ED Notes (Signed)
Pt alert and oriented x4. Respirations even and unlabored, bilateral symmetrical rise and fall of chest. Skin warm and dry. In no acute distress. Denies needs.   

## 2011-11-28 NOTE — ED Notes (Signed)
Pt escorted to d/c window. Verbalized understanding d/c instructions. In no acute distress.  

## 2011-11-28 NOTE — ED Notes (Signed)
Pt to CT

## 2011-11-28 NOTE — ED Notes (Signed)
Pt sts increased urination, pain with urination and low back pain x 3 days.  Sts abdominal pain and black stool x 2 occurences.  Pain score of 7/10 lower back and abdomen.  Denies blood in urine.  Hx of kidney stones.

## 2011-11-28 NOTE — ED Notes (Signed)
PA at bedside Pt alert and oriented x4. Respirations even and unlabored, bilateral symmetrical rise and fall of chest. Skin warm and dry. In no acute distress. Denies needs.   
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
## 2011-11-29 LAB — GC/CHLAMYDIA PROBE AMP, GENITAL: Chlamydia, DNA Probe: NEGATIVE

## 2011-11-29 NOTE — ED Provider Notes (Signed)
Medical screening examination/treatment/procedure(s) were performed by non-physician practitioner and as supervising physician I was immediately available for consultation/collaboration.  Raeford Razor, MD 11/29/11 [phone]

## 2012-03-19 ENCOUNTER — Emergency Department (INDEPENDENT_AMBULATORY_CARE_PROVIDER_SITE_OTHER)
Admission: EM | Admit: 2012-03-19 | Discharge: 2012-03-19 | Disposition: A | Discharge status: Home / Self Care | Payer: No Typology Code available for payment source | Source: Home / Self Care | Attending: Family Medicine | Admitting: Family Medicine

## 2012-03-19 ENCOUNTER — Encounter (HOSPITAL_COMMUNITY): Payer: Self-pay | Admitting: Emergency Medicine

## 2012-03-19 DIAGNOSIS — R609 Edema, unspecified: Secondary | ICD-10-CM

## 2012-03-19 LAB — POCT I-STAT, CHEM 8
BUN: 5 mg/dL — ABNORMAL LOW (ref 6–23)
Calcium, Ion: 1.2 mmol/L (ref 1.12–1.23)
Glucose, Bld: 74 mg/dL (ref 70–99)
HCT: 44 % (ref 39.0–52.0)
TCO2: 30 mmol/L (ref 0–100)

## 2012-03-19 MED ORDER — FUROSEMIDE 20 MG PO TABS: 20.0000 mg | ORAL_TABLET | Freq: Every day | ORAL | Status: DC

## 2012-03-19 NOTE — ED Notes (Signed)
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
## 2012-07-14 ENCOUNTER — Emergency Department (INDEPENDENT_AMBULATORY_CARE_PROVIDER_SITE_OTHER)
Admission: EM | Admit: 2012-07-14 | Discharge: 2012-07-14 | Disposition: A | Discharge status: Home / Self Care | Payer: Self-pay | Source: Home / Self Care

## 2012-07-14 ENCOUNTER — Encounter (HOSPITAL_COMMUNITY): Payer: Self-pay | Admitting: Emergency Medicine

## 2012-07-14 DIAGNOSIS — H6980 Other specified disorders of Eustachian tube, unspecified ear: Secondary | ICD-10-CM

## 2012-07-14 DIAGNOSIS — H9201 Otalgia, right ear: Secondary | ICD-10-CM

## 2012-07-14 DIAGNOSIS — H6981 Other specified disorders of Eustachian tube, right ear: Secondary | ICD-10-CM

## 2012-07-14 DIAGNOSIS — H9209 Otalgia, unspecified ear: Secondary | ICD-10-CM

## 2012-07-14 DIAGNOSIS — J309 Allergic rhinitis, unspecified: Secondary | ICD-10-CM

## 2012-07-14 MED ORDER — FLUTICASONE PROPIONATE 50 MCG/ACT NA SUSP: 2.0000 | Freq: Every day | NASAL | Status: DC

## 2012-07-14 MED ORDER — FEXOFENADINE HCL 180 MG PO TABS: 180.0000 mg | ORAL_TABLET | Freq: Every day | ORAL | Status: DC

## 2012-07-14 MED ORDER — TRIAMCINOLONE ACETONIDE 40 MG/ML IJ SUSP
40.0000 mg | Freq: Once | INTRAMUSCULAR | Status: AC
  Administered 2012-07-14: 40 mg via INTRAMUSCULAR

## 2012-07-14 MED ORDER — IBUPROFEN 800 MG PO TABS
800.0000 mg | ORAL_TABLET | Freq: Once | ORAL | Status: AC
  Administered 2012-07-14: 800 mg via ORAL

## 2012-07-14 MED ORDER — IBUPROFEN 800 MG PO TABS
ORAL_TABLET | ORAL | Status: AC
  Filled 2012-07-14: qty 1

## 2012-07-14 MED ORDER — TRIAMCINOLONE ACETONIDE 40 MG/ML IJ SUSP
INTRAMUSCULAR | Status: AC
  Filled 2012-07-14: qty 5

## 2012-07-14 MED ORDER — PHENYLEPHRINE-DM-GG 10-15-300 MG/5ML PO LIQD: 5.0000 mL | ORAL | Status: DC | PRN
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
## 2012-07-14 NOTE — ED Notes (Signed)
Pt c/o bilateral ear pain onset 2 days... sxs include: dizziness, vomiting, sensitive to loud noise, decreased hearing on right side Denies: fevers, diarrhea... Taking ear drops and ibup w/little no relief... He is alert and oriented w/no signs of acute distress.

## 2012-07-14 NOTE — ED Provider Notes (Signed)
Medical screening examination/treatment/procedure(s) were performed by non-physician practitioner and as supervising physician I was immediately available for consultation/collaboration.  Yared Barefoot, M.D.  Japheth Diekman C Cailan Antonucci, MD 07/14/12 2044 

## 2012-07-25 ENCOUNTER — Emergency Department (HOSPITAL_COMMUNITY)
Admission: EM | Admit: 2012-07-25 | Discharge: 2012-07-25 | Disposition: A | Discharge status: Home / Self Care | Payer: Self-pay | Attending: Emergency Medicine | Admitting: Emergency Medicine

## 2012-07-25 ENCOUNTER — Emergency Department (HOSPITAL_COMMUNITY): Payer: Self-pay

## 2012-07-25 DIAGNOSIS — Y929 Unspecified place or not applicable: Secondary | ICD-10-CM | POA: Insufficient documentation

## 2012-07-25 DIAGNOSIS — Z8619 Personal history of other infectious and parasitic diseases: Secondary | ICD-10-CM | POA: Insufficient documentation

## 2012-07-25 DIAGNOSIS — F17200 Nicotine dependence, unspecified, uncomplicated: Secondary | ICD-10-CM | POA: Insufficient documentation

## 2012-07-25 DIAGNOSIS — Z87448 Personal history of other diseases of urinary system: Secondary | ICD-10-CM | POA: Insufficient documentation

## 2012-07-25 DIAGNOSIS — Z79899 Other long term (current) drug therapy: Secondary | ICD-10-CM | POA: Insufficient documentation

## 2012-07-25 DIAGNOSIS — IMO0002 Reserved for concepts with insufficient information to code with codable children: Secondary | ICD-10-CM | POA: Insufficient documentation

## 2012-07-25 DIAGNOSIS — Y9389 Activity, other specified: Secondary | ICD-10-CM | POA: Insufficient documentation

## 2012-07-25 DIAGNOSIS — X503XXA Overexertion from repetitive movements, initial encounter: Secondary | ICD-10-CM | POA: Insufficient documentation

## 2012-07-25 DIAGNOSIS — F329 Major depressive disorder, single episode, unspecified: Secondary | ICD-10-CM | POA: Insufficient documentation

## 2012-07-25 DIAGNOSIS — F3289 Other specified depressive episodes: Secondary | ICD-10-CM | POA: Insufficient documentation

## 2012-07-25 DIAGNOSIS — Z8669 Personal history of other diseases of the nervous system and sense organs: Secondary | ICD-10-CM | POA: Insufficient documentation

## 2012-07-25 DIAGNOSIS — S29011A Strain of muscle and tendon of front wall of thorax, initial encounter: Secondary | ICD-10-CM

## 2012-07-25 DIAGNOSIS — F411 Generalized anxiety disorder: Secondary | ICD-10-CM | POA: Insufficient documentation

## 2012-07-25 DIAGNOSIS — S2341XA Sprain of ribs, initial encounter: Secondary | ICD-10-CM | POA: Insufficient documentation

## 2012-07-25 MED ORDER — METHOCARBAMOL 500 MG PO TABS: 500.0000 mg | ORAL_TABLET | Freq: Two times a day (BID) | ORAL | Status: DC

## 2012-07-25 MED ORDER — OXYCODONE-ACETAMINOPHEN 5-325 MG PO TABS
2.0000 | ORAL_TABLET | Freq: Once | ORAL | Status: AC
  Administered 2012-07-25: 2 via ORAL
  Filled 2012-07-25: qty 2

## 2012-07-25 MED ORDER — NAPROXEN 500 MG PO TABS: 500.0000 mg | ORAL_TABLET | Freq: Two times a day (BID) | ORAL | Status: DC

## 2012-07-25 MED ORDER — OXYCODONE-ACETAMINOPHEN 5-325 MG PO TABS: 1.0000 | ORAL_TABLET | ORAL | Status: DC | PRN

## 2012-07-25 NOTE — ED Notes (Signed)
Pt states he was unloading a truck and had a "coughing spell" at the beginning of the week and injured his R ribs. Pt has pain to R upper side of ribs. Pt states pain is worse with inspiration, mvmt, and palpation of area. Pt states he went to the chiropractor yesterday and he "popped the rib back in place". Pt states now the area hurts even more. Pt with no acute distress. Breaths even/unlabored. Pt ambulatory to exam room with steady gait. Pt states he has a ride home.

## 2012-07-25 NOTE — ED Provider Notes (Signed)
Medical screening examination/treatment/procedure(s) were performed by non-physician practitioner and as supervising physician I was immediately available for consultation/collaboration.   Rolan Bucco, MD 07/25/12 [phone]
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
## 2013-04-03 ENCOUNTER — Emergency Department (HOSPITAL_COMMUNITY)
Admission: EM | Admit: 2013-04-03 | Discharge: 2013-04-03 | Disposition: A | Discharge status: Home / Self Care | Payer: Self-pay | Attending: Emergency Medicine | Admitting: Emergency Medicine

## 2013-04-03 ENCOUNTER — Encounter (HOSPITAL_COMMUNITY): Payer: Self-pay | Admitting: Emergency Medicine

## 2013-04-03 DIAGNOSIS — Z8669 Personal history of other diseases of the nervous system and sense organs: Secondary | ICD-10-CM | POA: Insufficient documentation

## 2013-04-03 DIAGNOSIS — R42 Dizziness and giddiness: Secondary | ICD-10-CM | POA: Insufficient documentation

## 2013-04-03 DIAGNOSIS — F1120 Opioid dependence, uncomplicated: Secondary | ICD-10-CM | POA: Insufficient documentation

## 2013-04-03 DIAGNOSIS — IMO0002 Reserved for concepts with insufficient information to code with codable children: Secondary | ICD-10-CM | POA: Insufficient documentation

## 2013-04-03 DIAGNOSIS — R5381 Other malaise: Secondary | ICD-10-CM | POA: Insufficient documentation

## 2013-04-03 DIAGNOSIS — F17200 Nicotine dependence, unspecified, uncomplicated: Secondary | ICD-10-CM | POA: Insufficient documentation

## 2013-04-03 DIAGNOSIS — Z8619 Personal history of other infectious and parasitic diseases: Secondary | ICD-10-CM | POA: Insufficient documentation

## 2013-04-03 DIAGNOSIS — Z791 Long term (current) use of non-steroidal anti-inflammatories (NSAID): Secondary | ICD-10-CM | POA: Insufficient documentation

## 2013-04-03 DIAGNOSIS — Z79899 Other long term (current) drug therapy: Secondary | ICD-10-CM | POA: Insufficient documentation

## 2013-04-03 DIAGNOSIS — F29 Unspecified psychosis not due to a substance or known physiological condition: Secondary | ICD-10-CM | POA: Insufficient documentation

## 2013-04-03 DIAGNOSIS — R5383 Other fatigue: Secondary | ICD-10-CM

## 2013-04-03 DIAGNOSIS — F411 Generalized anxiety disorder: Secondary | ICD-10-CM | POA: Insufficient documentation

## 2013-04-03 DIAGNOSIS — Z87448 Personal history of other diseases of urinary system: Secondary | ICD-10-CM | POA: Insufficient documentation

## 2013-04-03 LAB — COMPREHENSIVE METABOLIC PANEL
ALT: 49 U/L (ref 0–53)
AST: 33 U/L (ref 0–37)
Albumin: 4.6 g/dL (ref 3.5–5.2)
Alkaline Phosphatase: 77 U/L (ref 39–117)
BUN: 28 mg/dL — ABNORMAL HIGH (ref 6–23)
CO2: 24 mEq/L (ref 19–32)
Calcium: 9.8 mg/dL (ref 8.4–10.5)
Chloride: 99 mEq/L (ref 96–112)
Creatinine, Ser: 1.09 mg/dL (ref 0.50–1.35)
GFR calc Af Amer: >90 mL/min (ref >90)
GFR calc non Af Amer: >90 mL/min (ref >90)
GLUCOSE: 183 mg/dL — AB (ref 70–99)
Potassium: 3.3 mEq/L — ABNORMAL LOW (ref 3.7–5.3)
SODIUM: 141 meq/L (ref 137–147)
Total Bilirubin: 2.9 mg/dL — ABNORMAL HIGH (ref 0.3–1.2)
Total Protein: 8.5 g/dL — ABNORMAL HIGH (ref 6.0–8.3)

## 2013-04-03 LAB — RAPID URINE DRUG SCREEN, HOSP PERFORMED
Amphetamines: NOT DETECTED
BARBITURATES: NOT DETECTED
BENZODIAZEPINES: NOT DETECTED
COCAINE: NOT DETECTED
Opiates: NOT DETECTED
Tetrahydrocannabinol: POSITIVE — AB

## 2013-04-03 LAB — CBC
HCT: 53.9 % — ABNORMAL HIGH (ref 39.0–52.0)
HEMOGLOBIN: 18.7 g/dL — AB (ref 13.0–17.0)
MCH: 31.1 pg (ref 26.0–34.0)
MCHC: 34.7 g/dL (ref 30.0–36.0)
MCV: 89.5 fL (ref 78.0–100.0)
Platelets: 314 10*3/uL (ref 150–400)
RBC: 6.02 MIL/uL — ABNORMAL HIGH (ref 4.22–5.81)
RDW: 13.3 % (ref 11.5–15.5)
WBC: 18.5 10*3/uL — ABNORMAL HIGH (ref 4.0–10.5)

## 2013-04-03 LAB — ETHANOL

## 2013-04-03 MED ORDER — NICOTINE 21 MG/24HR TD PT24
21.0000 mg | MEDICATED_PATCH | Freq: Every day | TRANSDERMAL | Status: DC
  Administered 2013-04-03: 21 mg via TRANSDERMAL
  Filled 2013-04-03: qty 1

## 2013-04-03 MED ORDER — ALUM & MAG HYDROXIDE-SIMETH 200-200-20 MG/5ML PO SUSP: 30.0000 mL | ORAL | Status: DC | PRN

## 2013-04-03 MED ORDER — LORAZEPAM 2 MG/ML IJ SOLN
1.0000 mg | Freq: Once | INTRAMUSCULAR | Status: AC
  Administered 2013-04-03: 1 mg via INTRAVENOUS
  Filled 2013-04-03: qty 1

## 2013-04-03 MED ORDER — SODIUM CHLORIDE 0.9 % IV BOLUS (SEPSIS)
1000.0000 mL | Freq: Once | INTRAVENOUS | Status: AC
  Administered 2013-04-03: 1000 mL via INTRAVENOUS

## 2013-04-03 MED ORDER — LORAZEPAM 1 MG PO TABS: 1.0000 mg | ORAL_TABLET | Freq: Three times a day (TID) | ORAL | Status: DC | PRN

## 2013-04-03 MED ORDER — ONDANSETRON HCL 4 MG PO TABS
4.0000 mg | ORAL_TABLET | Freq: Three times a day (TID) | ORAL | Status: DC | PRN
Start: 2013-04-03 — End: 2013-04-04

## 2013-04-03 NOTE — ED Notes (Signed)
Pt c/o "being out of it for a couple days."  Sts dizziness, confusion, and weakness.  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  Pt reports using heroin and xanax x 4 days ago.  Pt sts he has been in her care since 1800 yesterday.  Denies SI/ HI.

## 2013-04-03 NOTE — ED Notes (Signed)
Pt given urinal and made aware of need for urine specimen 

## 2013-04-03 NOTE — [facility] Assessment (Signed)
[facility] Assessment Progress Note  At 17:13 I spoke to EDP Dr Allen in anticipation of TTS assessment.  Nikkolas Coomes, MA Triage Specialist 04/03/2013 @ 17:18     
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
## 2013-04-03 NOTE — Progress Notes (Signed)
[facility] provided pt with a [facility] Orange Card application, highlighting [facility], to help patient establish primary care.  °

## 2013-04-03 NOTE — ED Notes (Signed)
Pt is awake and alert, pleasant and cooperative. Patient denies HI, SI AH or VH. Discharge vitals 139/89 HR 104 RR 16 and unlabored. Pt has outpatient treatment resources. Will continue to monitor for safety. Patient escorted to lobby without incident. Patient is discharged with grandfather Lawrence NethLewis RN

## 2013-04-03 NOTE — ED Provider Notes (Signed)
Medical Screening Exam:    This chart was scribed for non-physician practitioner, Trixie DredgeEmily Emiliana Blaize, PA-C, working with No att. providers found  Lorre NickAllen, Anthony MD by Charline BillsEssence Howell, ED Scribe. This patient was seen in room [facility] and the patient's care was started at 3:09 PM.   Patient presents concerned he "might need fluids" after heroin and xanax withdrawal.  He reports vomiting, dizziness, confusion and loss of appetite for the past 4 days. Pt unable to provide much history.    Pt is tachycardic (122), sweaty, uncomfortable appearing. Tachycardic, Lungs CTAB, abd soft.   Pt to be sent to the main ED, concern for possible benzo withdrawal, dehydration.   I personally performed the services described in this documentation, which was scribed in my presence. The recorded information has been reviewed and is accurate.    WestportEmily Baelynn Schmuhl, PA-C 04/03/13 [phone]
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
## 2013-04-03 NOTE — Discharge Instructions (Signed)
Chemical Dependency °Chemical dependency is an addiction to drugs or alcohol. It is characterized by the repeated behavior of seeking out and using drugs and alcohol despite harmful consequences to the health and safety of ones self and others.  °RISK FACTORS °There are certain situations or behaviors that increase a person's risk for chemical dependency. These include: °· A family history of chemical dependency. °· A history of mental health issues, including depression and anxiety. °· A home environment where drugs and alcohol are easily available to you. °· Drug or alcohol use at a young age. °SYMPTOMS  °The following symptoms can indicate chemical dependency: °· Inability to limit the use of drugs or alcohol. °· Nausea, sweating, shakiness, and anxiety that occurs when alcohol or drugs are not being used. °· An increase in amount of drugs or alcohol that is necessary to get drunk or high. °People who experience these symptoms can assess their use of drugs and alcohol by asking themselves the following questions: °· Have you been told by friends or family that they are worried about your use of alcohol or drugs? °· Do friends and family ever tell you about things you did while drinking alcohol or using drugs that you do not remember? °· Do you lie about using alcohol or drugs or about the amounts you use? °· Do you have difficulty completing daily tasks unless you use alcohol or drugs? °· Is the level of your work or school performance lower because of your drug or alcohol use? °· Do you get sick from using drugs or alcohol but keep using anyway? °· Do you feel uncomfortable in social situations unless you use alcohol or drugs? °· Do you use drugs or alcohol to help forget problems?  °An answer of yes to any of these questions may indicate chemical dependency. Professional evaluation is suggested. °Document Released: 01/09/2001 Document Revised: 04/09/2011 Document Reviewed: 03/23/2010 °Exit[employer]® Patient  Information ©2014 [employer]. ° °

## 2013-04-04 NOTE — ED Provider Notes (Signed)
Medical screening examination/treatment/procedure(s) were performed by non-physician practitioner and as supervising physician I was immediately available for consultation/collaboration.   Riot Barrick T Mukesh Kornegay, MD 04/04/13 0009 

## 2013-04-09 ENCOUNTER — Encounter (HOSPITAL_COMMUNITY): Payer: Self-pay | Admitting: Emergency Medicine

## 2013-04-09 ENCOUNTER — Emergency Department (HOSPITAL_COMMUNITY): Payer: Self-pay

## 2013-04-09 ENCOUNTER — Emergency Department (HOSPITAL_COMMUNITY)
Admission: EM | Admit: 2013-04-09 | Discharge: 2013-04-09 | Disposition: A | Discharge status: Home / Self Care | Payer: Self-pay | Attending: Emergency Medicine | Admitting: Emergency Medicine

## 2013-04-09 DIAGNOSIS — Z87448 Personal history of other diseases of urinary system: Secondary | ICD-10-CM | POA: Insufficient documentation

## 2013-04-09 DIAGNOSIS — H9319 Tinnitus, unspecified ear: Secondary | ICD-10-CM | POA: Insufficient documentation

## 2013-04-09 DIAGNOSIS — Z8619 Personal history of other infectious and parasitic diseases: Secondary | ICD-10-CM | POA: Insufficient documentation

## 2013-04-09 DIAGNOSIS — Z791 Long term (current) use of non-steroidal anti-inflammatories (NSAID): Secondary | ICD-10-CM | POA: Insufficient documentation

## 2013-04-09 DIAGNOSIS — R5383 Other fatigue: Secondary | ICD-10-CM

## 2013-04-09 DIAGNOSIS — F3289 Other specified depressive episodes: Secondary | ICD-10-CM | POA: Insufficient documentation

## 2013-04-09 DIAGNOSIS — R569 Unspecified convulsions: Secondary | ICD-10-CM

## 2013-04-09 DIAGNOSIS — G40909 Epilepsy, unspecified, not intractable, without status epilepticus: Secondary | ICD-10-CM | POA: Insufficient documentation

## 2013-04-09 DIAGNOSIS — Z79899 Other long term (current) drug therapy: Secondary | ICD-10-CM | POA: Insufficient documentation

## 2013-04-09 DIAGNOSIS — IMO0001 Reserved for inherently not codable concepts without codable children: Secondary | ICD-10-CM | POA: Insufficient documentation

## 2013-04-09 DIAGNOSIS — F329 Major depressive disorder, single episode, unspecified: Secondary | ICD-10-CM | POA: Insufficient documentation

## 2013-04-09 DIAGNOSIS — R51 Headache: Secondary | ICD-10-CM | POA: Insufficient documentation

## 2013-04-09 DIAGNOSIS — F411 Generalized anxiety disorder: Secondary | ICD-10-CM | POA: Insufficient documentation

## 2013-04-09 DIAGNOSIS — R5381 Other malaise: Secondary | ICD-10-CM | POA: Insufficient documentation

## 2013-04-09 DIAGNOSIS — F17200 Nicotine dependence, unspecified, uncomplicated: Secondary | ICD-10-CM | POA: Insufficient documentation

## 2013-04-09 HISTORY — DX: Unspecified convulsions: R56.9

## 2013-04-09 LAB — COMPREHENSIVE METABOLIC PANEL
ALK PHOS: 72 U/L (ref 39–117)
ALT: 44 U/L (ref 0–53)
AST: 30 U/L (ref 0–37)
Albumin: 3.8 g/dL (ref 3.5–5.2)
BILIRUBIN TOTAL: 0.6 mg/dL (ref 0.3–1.2)
BUN: 10 mg/dL (ref 6–23)
CHLORIDE: 105 meq/L (ref 96–112)
CO2: 24 meq/L (ref 19–32)
Calcium: 9.1 mg/dL (ref 8.4–10.5)
Creatinine, Ser: 0.74 mg/dL (ref 0.50–1.35)
GFR calc non Af Amer: >90 mL/min (ref >90)
GLUCOSE: 91 mg/dL (ref 70–99)
Potassium: 4 mEq/L (ref 3.7–5.3)
Sodium: 141 mEq/L (ref 137–147)
Total Protein: 6.9 g/dL (ref 6.0–8.3)

## 2013-04-09 LAB — URINALYSIS, ROUTINE W REFLEX MICROSCOPIC
Bilirubin Urine: NEGATIVE
Glucose, UA: NEGATIVE mg/dL
HGB URINE DIPSTICK: NEGATIVE
KETONES UR: NEGATIVE mg/dL
Leukocytes, UA: NEGATIVE
Nitrite: NEGATIVE
Protein, ur: NEGATIVE mg/dL
Specific Gravity, Urine: 1.012 (ref 1.005–1.030)
UROBILINOGEN UA: 0.2 mg/dL (ref 0.0–1.0)
pH: 6.5 (ref 5.0–8.0)

## 2013-04-09 LAB — CBC WITH DIFFERENTIAL/PLATELET
Basophils Absolute: 0.1 10*3/uL (ref 0.0–0.1)
Basophils Relative: 1 % (ref 0–1)
Eosinophils Absolute: 0.3 10*3/uL (ref 0.0–0.7)
Eosinophils Relative: 3 % (ref 0–5)
HCT: 45.9 % (ref 39.0–52.0)
Hemoglobin: 15.8 g/dL (ref 13.0–17.0)
LYMPHS ABS: 2.9 10*3/uL (ref 0.7–4.0)
LYMPHS PCT: 27 % (ref 12–46)
MCH: 30.7 pg (ref 26.0–34.0)
MCHC: 34.4 g/dL (ref 30.0–36.0)
MCV: 89.1 fL (ref 78.0–100.0)
MONOS PCT: 7 % (ref 3–12)
Monocytes Absolute: 0.8 10*3/uL (ref 0.1–1.0)
NEUTROS ABS: 6.8 10*3/uL (ref 1.7–7.7)
NEUTROS PCT: 63 % (ref 43–77)
PLATELETS: 263 10*3/uL (ref 150–400)
RBC: 5.15 MIL/uL (ref 4.22–5.81)
RDW: 13.2 % (ref 11.5–15.5)
WBC: 10.8 10*3/uL — AB (ref 4.0–10.5)

## 2013-04-09 LAB — RAPID URINE DRUG SCREEN, HOSP PERFORMED
Amphetamines: NOT DETECTED
BARBITURATES: NOT DETECTED
Benzodiazepines: POSITIVE — AB
Cocaine: NOT DETECTED
OPIATES: POSITIVE — AB
TETRAHYDROCANNABINOL: POSITIVE — AB

## 2013-04-09 LAB — MAGNESIUM: MAGNESIUM: 2.2 mg/dL (ref 1.5–2.5)

## 2013-04-09 LAB — CBG MONITORING, ED: Glucose-Capillary: 94 mg/dL (ref 70–99)

## 2013-04-09 MED ORDER — NAPROXEN 500 MG PO TBEC: 500.0000 mg | DELAYED_RELEASE_TABLET | Freq: Two times a day (BID) | ORAL | Status: DC

## 2013-04-09 MED ORDER — KETOROLAC TROMETHAMINE 30 MG/ML IJ SOLN
30.0000 mg | Freq: Once | INTRAMUSCULAR | Status: AC
  Administered 2013-04-09: 30 mg via INTRAVENOUS
  Filled 2013-04-09: qty 1

## 2013-04-09 MED ORDER — LORAZEPAM 2 MG/ML IJ SOLN
1.0000 mg | Freq: Once | INTRAMUSCULAR | Status: AC
  Administered 2013-04-09: 1 mg via INTRAVENOUS
  Filled 2013-04-09: qty 1

## 2013-04-09 MED ORDER — SODIUM CHLORIDE 0.9 % IV SOLN
INTRAVENOUS | Status: DC
  Administered 2013-04-09: 13:00:00 via INTRAVENOUS

## 2013-04-09 MED ORDER — LORAZEPAM 1 MG PO TABS: 1.0000 mg | ORAL_TABLET | Freq: Three times a day (TID) | ORAL | Status: DC | PRN

## 2013-04-09 NOTE — ED Notes (Signed)
Felicia at bedside educating pt on resources

## 2013-04-09 NOTE — Discharge Instructions (Signed)
Seizure, Adult A seizure is abnormal electrical activity in the brain. Seizures usually last from 30 seconds to 2 minutes. There are various types of seizures. Before a seizure, you may have a warning sensation (aura) that a seizure is about to occur. An aura may include the following symptoms:   Fear or anxiety.  Nausea.  Feeling like the room is spinning (vertigo).  Vision changes, such as seeing flashing lights or spots. Common symptoms during a seizure include:  A change in attention or behavior (altered mental status).  Convulsions with rhythmic jerking movements.  Drooling.  Rapid eye movements.  Grunting.  Loss of bladder and bowel control.  Bitter taste in the mouth.  Tongue biting. After a seizure, you may feel confused and sleepy. You may also have an injury resulting from convulsions during the seizure. HOME CARE INSTRUCTIONS   If you are given medicines, take them exactly as prescribed by your health care provider.  Keep all follow-up appointments as directed by your health care provider.  Do not swim or drive or engage in risky activity during which a seizure could cause further injury to you or others until your health care provider says it is OK.  Get adequate rest.  Teach friends and family what to do if you have a seizure. They should:  Lay you on the ground to prevent a fall.  Put a cushion under your head.  Loosen any tight clothing around your neck.  Turn you on your side. If vomiting occurs, this helps keep your airway clear.  Stay with you until you recover.  Know whether or not you need emergency care. SEEK IMMEDIATE MEDICAL CARE IF:  The seizure lasts longer than 5 minutes.  The seizure is severe or you do not wake up immediately after the seizure.  You have an altered mental status after the seizure.  You are having more frequent or worsening seizures. Someone should drive you to the emergency department or call local emergency  services (911 in U.[place].). MAKE SURE YOU:  Understand these instructions.  Will watch your condition.  Will get help right away if you are not doing well or get worse. Document Released: 01/13/2000 Document Revised: 11/05/2012 Document Reviewed: 08/27/2012 [facility] Patient Information 2014 [place], [place].  Workup here today negative. Need additional workup for further evaluation of seizures. Take the Ativan as directed for the next few days. This should help prevent any additional seizures. Also take the Naprosyn as needed for pain. [facility]. Resource guide provided below for additional resources in the community. Referral to neurology provided.    Emergency Department Resource Guide 1) Find a Doctor and Pay Out of Pocket Although you won't have to find out who is covered by your insurance plan, it is a good idea to ask around and get recommendations. You will then need to call the office and see if the doctor you have chosen will accept you as a new patient and what types of options they offer for patients who are self-pay. Some doctors offer discounts or will set up payment plans for their patients who do not have insurance, but you will need to ask so you aren't surprised when you get to your appointment.  2) Contact Your Local Health Department Not all health departments have doctors that can see patients for sick visits, but many do, so it is worth a call to see if yours does. If you don't know where your local health department is, you can  check in your phone book. The CDC also has a tool to help you locate your state's health department, and many state websites also have listings of all of their local health departments.  3) Find a Walk-in Clinic If your illness is not likely to be very severe or complicated, you may want to try a walk in clinic. These are popping up all over the country in pharmacies, drugstores, and shopping centers. They're usually staffed by  nurse practitioners or physician assistants that have been trained to treat common illnesses and complaints. They're usually fairly quick and inexpensive. However, if you have serious medical issues or chronic medical problems, these are probably not your best option.  No Primary Care Doctor: - Call Health Connect at  [phone] - they can help you locate a primary care doctor that  accepts your insurance, provides certain services, etc. - Physician Referral Service- [phone]  Chronic Pain Problems: Organization         Address  Phone   Notes  [facility]  [phone] Patients need to be referred by their primary care doctor.   Medication Assistance: Organization         Address  Phone   Notes  [facility] Medication [facility] [address] [place], [place] [address] [phone] --Must be a resident of [facility] -- Must have NO insurance coverage whatsoever (no Medicaid/ Medicare, etc.) -- The pt. MUST have a primary care doctor that directs their care regularly and follows them in the community   [facility]  [phone]   [employer]  [phone]    Agencies that provide inexpensive medical care: Organization         Address  Phone   Notes  [facility]  [phone]   [facility]    [phone]   [facility] [address] [place], [place] [address] [phone]   [facility] [address] [place], [place] [phone]   Planned Parenthood    [phone]   [facility]    [phone]   [facility]  [address], [place] Phone:  [phone], Fax:  [phone] Hours of Operation:  9 am - 6 pm, M-F.  Also accepts Medicaid/Medicare and self-pay.  [facility]  [address], [place] Phone: [phone], Fax: [phone]. Hours of Operation:  8:30  am - 5:30 pm, M-F.  Also accepts Medicaid and self-pay.  [facility] [place] [address] [place] Phone: [phone]   [facility] [address] Natasha Bence [place], [place] [phone], Ext. [phone] Mondays & Thursdays: 7-9 AM.  First 15 patients are seen on a first come, first serve basis.    Medicaid-accepting [facility] Providers:  Organization         Address  Phone   Notes  [facility] [address], [place] [phone] Also accepts self-pay patients.  [facility] [address] Laurell Josephs [place], [place]  [phone]   [facility] [address], [place] [phone]   [facility] [address], [place] [phone]   Renaye Rakers [address], [place]   [phone] Only accepts [place] patients after they have their name applied to  their card.   Self-Pay (no insurance) in [facility]:  Organization         Address  Phone   Notes  Sickle Cell Patients, [facility] [address] [place], [place] [phone]   [facility] [address] [place], [place] [phone]   [facility] [place]  [address], [place] [phone]   [facility]/Dr. Osei-Bonsu  [address], [place] or [address], [place] [phone] Phone number for both [place] and [place] locations is the same.  [facility] and [facility] [address] [place] [phone]   [facility] [address] [place] or [address] [phone]   [facility] [address], [place] [phone], phone; [phone], fax Sees patients 1st and 3rd Saturday of every month.  Must not qualify for public or private insurance (i.e. Medicaid, Medicare, [place] Health  Choice, Veterans' Benefits)  Household income should be no more than 200% of the poverty level The clinic cannot treat you if you are pregnant or think you are pregnant  Sexually transmitted diseases are not treated at the clinic.    Dental Care: Organization         Address  Phone  Notes  [facility] Department of [facility] [address] [place], [place] [phone] Accepts children up to age 68 who are enrolled in [place] or [place] Health Choice; pregnant women with a Medicaid card; and children who have applied for Medicaid or [place] Health Choice, but were declined, whose parents can pay a reduced fee at time of service.  [facility] Department of [facility]  [address], [place] [phone] Accepts children up to age 79 who are enrolled in [place] or [place] Health Choice; pregnant women with a Medicaid card; and children who have applied for Medicaid or [place] Health Choice, but were declined, whose parents can pay a reduced fee at time of service.  [facility]  [address] [place], [place] [phone] Patients are seen by appointment only. Walk-ins are not accepted. [facility] will see patients 26 years of age and older. Monday - Tuesday (8am-5pm) Most Wednesdays (8:30-5pm) $30 per visit, cash only  [facility]  [address], [facility] [phone] Patients are seen by appointment only. Walk-ins are not accepted. [facility] will see patients 61 years of age and older. One Wednesday Evening (Monthly: Volunteer Based).  $30 per visit, cash only  [employer]  [phone] for adults; Children under age 49, call Graduate Pediatric Dentistry at [phone]. Children aged 55-14, please call [phone] to request a pediatric application.  Dental services are provided in all areas of dental care including fillings, crowns and bridges, complete  and partial dentures, implants, gum treatment, root canals, and extractions. Preventive care is also provided. Treatment is provided to both adults and children. Patients are selected via a lottery and there is often a waiting list.   [facility] [address] [place]  [phone] [email or web address]   [facility] [address] [place], [place] [phone], Ext. [phone] Second and Fourth Thursday of each month, opens at 6:30 AM; Clinic ends at 9 AM.  Patients are seen on a first-come first-served basis,  and a limited number are seen during each clinic.   [facility]  [address], [place], [place] [phone]   Eligibility Requirements You must have lived in [place], [place], or [place] counties for at least the last three months.   You cannot be eligible for state or federal sponsored National Cityhealthcare insurance, including CIGNAVeterans Administration, [place]icaid, or [employer]Medicare.   You generally cannot be eligible for healthcare insurance through your employer.    How to apply: Eligibility screenings are held every Tuesday and Wednesday afternoon from 1:00 pm until 4:00 pm. You do not need an appointment for the interview!  [facility] [address], [place], [place] [phone]   [facility]  [phone]   [facility]  [phone]   [facility]  [phone]    Behavioral Health Resources in the Community: Intensive Outpatient Programs Organization         Address  Phone  Notes  [facility] [address], [place], [place] [phone]   [facility] [address], [place], [place] [phone]   [facility] [address], [place], [place]  [phone]   [facility] [address],  [place], [place] [phone] or [phone]   Substance Abuse Resources Organization          Address  Phone  Notes  [facility]  [phone]   [facility]  [phone]   [facility]  [phone]   [facility]  [phone]   Residential & Outpatient Substance Abuse Program  [phone]   Psychological Services Organization         Address  Phone  Notes  [facility]  [phone]- [phone]   [facility]  [phone]- [phone]   [facility] [address], [place] [phone] or [phone]    Mobile Crisis Teams Organization         Address  Phone  Notes  [facility], Mobile Crisis Care Unit  [phone]   [facility]  [address] [place], [place] [phone]   Doristine LocksSharon DeEsch [address] [place] [phone]    Self-Help/Support Groups Organization         Address  Phone             Notes  [facility] [place] - variety of support groups  [phone] Call for more information  Narcotics Anonymous (NA), [facility] [address], [place]  2 meetings at this location   Statisticianesidential Treatment Programs Organization         Address  Phone  Notes  [facility] [address],    [place]  [phone]   [facility]  [address], [place] [phone], [place], [place] [phone]   [facility] [address], [place] [phone] Admissions: 8am-3pm M-F  [facility] [address]    [place], [place] [phone]   [facility] [address], [place], [place] [phone]   [facility] [address]  [place], [place] [phone]   [facility] - Intensive Outpatient [address], [place], [place] [phone]   [facility] ([facility] [address]  [place], [place] [phone] or [phone]   [facility] ([facility]) [address] [place], [place]  [phone] Accepts Medicaid  [place] [address],  [place] [phone] Substance Abuse/Addiction Treatment   [facility] Organization         Address  Phone  Notes  [facility]  [phone]   Domenic Schwab, PhD [address] [place], [place]   [phone] or [phone]   Alfred [place], [place] [phone]   [address], [place], [place] [phone] Insurance/Medicaid/sponsorship through [facility] and Families [address]                                    [place], [place] [phone] [place] [address], [place] [phone]    Dr. Adele Schilder  [phone]   [facility] Dept. 1) [address] [place] 2) [place] 3)  [address], [place] [phone]  [phone]   [place] [phone] or [phone] (After Hours)

## 2013-04-09 NOTE — ED Notes (Signed)
Attempted to draw lab work x2. unsuccessful

## 2013-04-09 NOTE — Discharge Planning (Signed)
P4[facility] Felicia E, [employer] Liaison  Spoke with patient about primary care resources and getting established with a PCP. Patient states he is employed and should be obtaining insurance through his job after 90 days. Patient also states [facility] and was informed that he could get the orange card there. Resource guide and my contact information was given for any future questions or concerns.

## 2013-04-09 NOTE — ED Notes (Signed)
Pt reports he thinks he had a seizure yesterday and today. hes had them in the past but he hasnt been on seizure meds for 2 years. He states the seizures happen while hes asleep, he wakes up and can not move his body and feels sweaty and hes foaming at the lips. Yesterday am he also had a ringing in his ears and his vision in his R eye "went blank."  His family wanted him to come for evaluation but he did not want to come yesterday. He is A&Ox4 now.
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
## 2013-04-29 ENCOUNTER — Emergency Department (HOSPITAL_COMMUNITY)
Admission: EM | Admit: 2013-04-29 | Discharge: 2013-04-29 | Disposition: A | Discharge status: Home / Self Care | Payer: Self-pay | Attending: Emergency Medicine | Admitting: Emergency Medicine

## 2013-04-29 ENCOUNTER — Encounter (HOSPITAL_COMMUNITY): Payer: Self-pay | Admitting: Emergency Medicine

## 2013-04-29 DIAGNOSIS — M2550 Pain in unspecified joint: Secondary | ICD-10-CM | POA: Insufficient documentation

## 2013-04-29 DIAGNOSIS — R5381 Other malaise: Secondary | ICD-10-CM | POA: Insufficient documentation

## 2013-04-29 DIAGNOSIS — Z87448 Personal history of other diseases of urinary system: Secondary | ICD-10-CM | POA: Insufficient documentation

## 2013-04-29 DIAGNOSIS — R51 Headache: Secondary | ICD-10-CM | POA: Insufficient documentation

## 2013-04-29 DIAGNOSIS — R5383 Other fatigue: Secondary | ICD-10-CM

## 2013-04-29 DIAGNOSIS — E860 Dehydration: Secondary | ICD-10-CM | POA: Insufficient documentation

## 2013-04-29 DIAGNOSIS — F411 Generalized anxiety disorder: Secondary | ICD-10-CM | POA: Insufficient documentation

## 2013-04-29 DIAGNOSIS — F1320 Sedative, hypnotic or anxiolytic dependence, uncomplicated: Secondary | ICD-10-CM | POA: Insufficient documentation

## 2013-04-29 DIAGNOSIS — R569 Unspecified convulsions: Secondary | ICD-10-CM

## 2013-04-29 DIAGNOSIS — R112 Nausea with vomiting, unspecified: Secondary | ICD-10-CM | POA: Insufficient documentation

## 2013-04-29 DIAGNOSIS — F19939 Other psychoactive substance use, unspecified with withdrawal, unspecified: Secondary | ICD-10-CM | POA: Insufficient documentation

## 2013-04-29 DIAGNOSIS — F17200 Nicotine dependence, unspecified, uncomplicated: Secondary | ICD-10-CM | POA: Insufficient documentation

## 2013-04-29 DIAGNOSIS — IMO0001 Reserved for inherently not codable concepts without codable children: Secondary | ICD-10-CM | POA: Insufficient documentation

## 2013-04-29 DIAGNOSIS — Z8619 Personal history of other infectious and parasitic diseases: Secondary | ICD-10-CM | POA: Insufficient documentation

## 2013-04-29 DIAGNOSIS — Z79899 Other long term (current) drug therapy: Secondary | ICD-10-CM | POA: Insufficient documentation

## 2013-04-29 DIAGNOSIS — G40909 Epilepsy, unspecified, not intractable, without status epilepticus: Secondary | ICD-10-CM | POA: Insufficient documentation

## 2013-04-29 DIAGNOSIS — F13239 Sedative, hypnotic or anxiolytic dependence with withdrawal, unspecified: Secondary | ICD-10-CM

## 2013-04-29 DIAGNOSIS — F13939 Sedative, hypnotic or anxiolytic use, unspecified with withdrawal, unspecified: Secondary | ICD-10-CM

## 2013-04-29 LAB — URINALYSIS, ROUTINE W REFLEX MICROSCOPIC
Bilirubin Urine: NEGATIVE
GLUCOSE, UA: NEGATIVE mg/dL
KETONES UR: NEGATIVE mg/dL
LEUKOCYTES UA: NEGATIVE
NITRITE: NEGATIVE
Protein, ur: NEGATIVE mg/dL
SPECIFIC GRAVITY, URINE: 1.015 (ref 1.005–1.030)
Urobilinogen, UA: 1 mg/dL (ref 0.0–1.0)
pH: 7 (ref 5.0–8.0)

## 2013-04-29 LAB — CBC WITH DIFFERENTIAL/PLATELET
BASOS ABS: 0 10*3/uL (ref 0.0–0.1)
BASOS PCT: 0 % (ref 0–1)
Eosinophils Absolute: 0.1 10*3/uL (ref 0.0–0.7)
Eosinophils Relative: 0 % (ref 0–5)
HEMATOCRIT: 48.2 % (ref 39.0–52.0)
Hemoglobin: 17.5 g/dL — ABNORMAL HIGH (ref 13.0–17.0)
Lymphocytes Relative: 19 % (ref 12–46)
Lymphs Abs: 2.9 10*3/uL (ref 0.7–4.0)
MCH: 31.3 pg (ref 26.0–34.0)
MCHC: 36.3 g/dL — AB (ref 30.0–36.0)
MCV: 86.1 fL (ref 78.0–100.0)
MONO ABS: 1.5 10*3/uL — AB (ref 0.1–1.0)
MONOS PCT: 9 % (ref 3–12)
NEUTROS PCT: 72 % (ref 43–77)
Neutro Abs: 11.1 10*3/uL — ABNORMAL HIGH (ref 1.7–7.7)
Platelets: 298 10*3/uL (ref 150–400)
RBC: 5.6 MIL/uL (ref 4.22–5.81)
RDW: 13.1 % (ref 11.5–15.5)
WBC: 15.5 10*3/uL — ABNORMAL HIGH (ref 4.0–10.5)

## 2013-04-29 LAB — COMPREHENSIVE METABOLIC PANEL
ALBUMIN: 4.6 g/dL (ref 3.5–5.2)
ALT: 93 U/L — ABNORMAL HIGH (ref 0–53)
AST: 54 U/L — ABNORMAL HIGH (ref 0–37)
Alkaline Phosphatase: 96 U/L (ref 39–117)
BUN: 13 mg/dL (ref 6–23)
CO2: 29 mEq/L (ref 19–32)
CREATININE: 0.86 mg/dL (ref 0.50–1.35)
Calcium: 9.6 mg/dL (ref 8.4–10.5)
Chloride: 90 mEq/L — ABNORMAL LOW (ref 96–112)
GFR calc Af Amer: >90 mL/min (ref >90)
GFR calc non Af Amer: >90 mL/min (ref >90)
Glucose, Bld: 121 mg/dL — ABNORMAL HIGH (ref 70–99)
Potassium: 3.6 mEq/L — ABNORMAL LOW (ref 3.7–5.3)
Sodium: 136 mEq/L — ABNORMAL LOW (ref 137–147)
TOTAL PROTEIN: 8.5 g/dL — AB (ref 6.0–8.3)
Total Bilirubin: 1.8 mg/dL — ABNORMAL HIGH (ref 0.3–1.2)

## 2013-04-29 LAB — RAPID URINE DRUG SCREEN, HOSP PERFORMED
AMPHETAMINES: NOT DETECTED
BARBITURATES: NOT DETECTED
BENZODIAZEPINES: POSITIVE — AB
COCAINE: NOT DETECTED
OPIATES: POSITIVE — AB
Tetrahydrocannabinol: POSITIVE — AB

## 2013-04-29 LAB — URINE MICROSCOPIC-ADD ON

## 2013-04-29 LAB — ETHANOL

## 2013-04-29 LAB — CK: CK TOTAL: 36 U/L (ref 7–232)

## 2013-04-29 MED ORDER — GI COCKTAIL ~~LOC~~
30.0000 mL | Freq: Once | ORAL | Status: AC
  Administered 2013-04-29: 30 mL via ORAL
  Filled 2013-04-29: qty 30

## 2013-04-29 MED ORDER — SODIUM CHLORIDE 0.9 % IV BOLUS (SEPSIS)
1000.0000 mL | Freq: Once | INTRAVENOUS | Status: AC
  Administered 2013-04-29: 1000 mL via INTRAVENOUS

## 2013-04-29 MED ORDER — ONDANSETRON HCL 4 MG/2ML IJ SOLN
4.0000 mg | Freq: Once | INTRAMUSCULAR | Status: AC
  Administered 2013-04-29: 4 mg via INTRAVENOUS
  Filled 2013-04-29: qty 2

## 2013-04-29 NOTE — Progress Notes (Addendum)
   CARE MANAGEMENT ED NOTE 04/29/2013  Patient:  Lawrence [name],Lawrence [name]   Account Number:  [id number]  Date Initiated:  04/29/2013  Documentation initiated by:  Michel BickersOGERS,Conway Fedora  Subjective/Objective Assessment:   Patient is a 28 y.o. male [facility]     Subjective/Objective Assessment Detail:     Action/Plan:   Action/Plan Detail:   Anticipated DC Date:  04/29/2013     Status Recommendation to Physician:   Result of Recommendation:  Agreed    DC Planning Services  CM consult  PCP issues    Choice offered to / List presented to:  C-1 Patient          Status of service:  Completed, signed off  ED Comments:   ED Comments Detail:  04/29/2013 2:29 [name]. Lawrence RudRogers RN BSN [phone] ED CM Consulted to meet with patient concerning f/u care with PCP, patient does not have insurance. [facility] with seizures. Pt has had 3  ED visits within 6 months. In room to meet with patient at and grand father at bedise. Consent given by patient to discuss care in the presence of  Grandfather  Pt  reports not having PCP to f/u care. Discussed with patient importance and benefits of f/u with PCP, and not utilizing the ED for primary care needs. Pt verbalized understanding and is in agreement. Discussed [facility]  Liaison Felicia regarding the [employer]. Pt apparently was given the application and [facility] month but, missed the appt. Provided patient with list of Affordable Primary Care practices. [facility]. Offered to schedule appt, pt provided consent to schedule appt for establishing care for f/u. Appt made to establish care first available appt. May 19th at 3:00p Pt made aware verbally and given Clinic brochure with date, time, Address and phone number of clinic. Pt states he does not have an issue obtaining medications uses $4 dollar list at [facility] Pt verbalized understanding Verified current  phone number  . No further ED CM needs identified.ED CM.

## 2013-04-29 NOTE — ED Notes (Addendum)
Pt states he had an unwitnessed seizure last night in his sleep.  Pt states he woke up with foam in his mouth.  Pt states he was seen recently for seizures and sent home with Ativan Rx.  Pt states he ran out of that and that is why he had a seizure last night.  Pt seen on 03/12 for same, please see MD notes.
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
## 2013-04-29 NOTE — Discharge Instructions (Signed)
Drink plenty of fluids. You are dehydrated. Avoid any non prescribed drugs. See seizure precautions below. Follow up with your doctor as soon as able.    Driving and Equipment Restrictions Some medical problems make it dangerous to drive, ride a bike, or use machines. Some of these problems are:  A hard blow to the head (concussion).  Passing out (fainting).  Twitching and shaking (seizures).  Low blood sugar.  Taking medicine to help you relax (sedatives).  Taking pain medicines.  Wearing an eye patch.  Wearing splints. This can make it hard to use parts of your body that you need to drive safely. HOME CARE   Do not drive until your doctor says it is okay.  Do not use machines until your doctor says it is okay. You may need a form signed by your doctor (medical release) before you can drive again. You may also need this form before you do other tasks where you need to be fully alert. MAKE SURE YOU:  Understand these instructions.  Will watch your condition.  Will get help right away if you are not doing well or get worse. Document Released: 02/23/2004 Document Revised: 04/09/2011 Document Reviewed: 05/25/2009 [facility] Patient Information 2014 [employer].

## 2013-04-30 LAB — URINE CULTURE
Colony Count: NO GROWTH
Culture: NO GROWTH

## 2013-05-05 NOTE — ED Provider Notes (Signed)
Medical screening examination/treatment/procedure(s) were performed by non-physician practitioner and as supervising physician I was immediately available for consultation/collaboration.   EKG Interpretation   Date/Time:  Wednesday April 29 2013 12:54:01 EDT Ventricular Rate:  124 PR Interval:  124 QRS Duration: 80 QT Interval:  296 QTC Calculation: 425 R Axis:   64 Text Interpretation:  Sinus tachycardia Otherwise normal ECG ED PHYSICIAN  INTERPRETATION AVAILABLE IN [facility] Confirmed by TEST, Record  ([id number]) on 05/01/2013 7:14:18 AM      Devoria AlbeIva Reegan Bouffard, MD, [facility]P   Aariya Ferrick L Willia Lampert, MD 05/05/13 [phone]

## 2013-06-16 ENCOUNTER — Ambulatory Visit: Payer: Self-pay | Attending: Internal Medicine | Admitting: Internal Medicine

## 2013-06-16 ENCOUNTER — Encounter: Payer: Self-pay | Admitting: Internal Medicine

## 2013-06-16 VITALS — BP 124/86 | HR 99 | Temp 98.8°F | Resp 17 | Wt 189.0 lb

## 2013-06-16 DIAGNOSIS — F1110 Opioid abuse, uncomplicated: Secondary | ICD-10-CM | POA: Insufficient documentation

## 2013-06-16 DIAGNOSIS — Z139 Encounter for screening, unspecified: Secondary | ICD-10-CM

## 2013-06-16 DIAGNOSIS — F1911 Other psychoactive substance abuse, in remission: Secondary | ICD-10-CM

## 2013-06-16 DIAGNOSIS — F17200 Nicotine dependence, unspecified, uncomplicated: Secondary | ICD-10-CM

## 2013-06-16 DIAGNOSIS — Z130 Encounter for screening for diseases of the blood and blood-forming organs and certain disorders involving the immune mechanism: Secondary | ICD-10-CM | POA: Insufficient documentation

## 2013-06-16 DIAGNOSIS — F32A Depression, unspecified: Secondary | ICD-10-CM

## 2013-06-16 DIAGNOSIS — B182 Chronic viral hepatitis C: Secondary | ICD-10-CM | POA: Insufficient documentation

## 2013-06-16 DIAGNOSIS — B1920 Unspecified viral hepatitis C without hepatic coma: Secondary | ICD-10-CM

## 2013-06-16 DIAGNOSIS — R569 Unspecified convulsions: Secondary | ICD-10-CM

## 2013-06-16 DIAGNOSIS — F1210 Cannabis abuse, uncomplicated: Secondary | ICD-10-CM | POA: Insufficient documentation

## 2013-06-16 DIAGNOSIS — G40909 Epilepsy, unspecified, not intractable, without status epilepticus: Secondary | ICD-10-CM | POA: Insufficient documentation

## 2013-06-16 DIAGNOSIS — F1910 Other psychoactive substance abuse, uncomplicated: Secondary | ICD-10-CM

## 2013-06-16 DIAGNOSIS — F3289 Other specified depressive episodes: Secondary | ICD-10-CM

## 2013-06-16 DIAGNOSIS — F411 Generalized anxiety disorder: Secondary | ICD-10-CM

## 2013-06-16 DIAGNOSIS — F329 Major depressive disorder, single episode, unspecified: Secondary | ICD-10-CM

## 2013-06-16 MED ORDER — NICOTINE 21 MG/24HR TD PT24: 21.0000 mg | MEDICATED_PATCH | Freq: Every day | TRANSDERMAL | Status: DC

## 2013-06-16 MED ORDER — PAROXETINE HCL 20 MG PO TABS: 20.0000 mg | ORAL_TABLET | Freq: Every day | ORAL | Status: DC

## 2013-06-16 NOTE — Progress Notes (Signed)
Patient Demographics  Lawrence [name], is a 28 y.o. male  ZOX:[id number]  [id number]:[id number]  DOB - 1985/11/26  CC:  Chief Complaint  Patient presents with  . Hospitalization Follow-up       HPI: Lawrence [name] is a 28 y.o. male here today to establish medical care.Patient has history of seizure disorder has been to the ER a few times with episode of seizures, patient takes Ativan, he has history of anxiety, history of IV drug abuse, history of hep C,, polysubstance abuse including Xanax, as per patient he used Paxil in the past which had him with the symptoms, currently denies any SI or HI, history of hep C and used to follow up with her specialist but never been treated since patient last the insurance, as per patient he was also following up with psychiatrist in the past. Patient also currently smoke cigarettes, I have advised patient to quit smoking. Patient has No headache, No chest pain, No abdominal pain - No Nausea, No new weakness tingling or numbness, No Cough - SOB.  Allergies  Allergen Reactions  . Tramadol Hives, Itching, Swelling and Other (See Comments)    Cold sweats, makes pt feel "really weird"  . Other     cilantro oil=Anaphylaxis  . Acetaminophen-Codeine [Acetaminophen-Codeine] Itching and Photosensitivity    Pt states he took tylenol-3 when he had wisdom teeth taken out (6 years ago) states it caused headaches and itching    Past Medical History  Diagnosis Date  . Depression   . New onset of headaches     post op 01/2010  . Nonspecific elevation of levels of transaminase or lactic acid dehydrogenase (LDH)     01/2010  . Anxiety   . Hepatitis C   . Back pain   . Shoulder pain, right   . 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 Renal disorder   . Seizure    Current Outpatient Prescriptions on File Prior to Visit  Medication Sig Dispense Refill  . calcium carbonate (TUMS EX) 750 MG chewable tablet Chew 1-2 tablets by mouth daily as  needed for heartburn.      Marland Kitchen ibuprofen (ADVIL,MOTRIN) 200 MG tablet Take 200-600 mg by mouth every 6 (six) hours as needed for fever, headache or mild pain.      Marland Kitchen LORazepam (ATIVAN) 1 MG tablet Take 1 tablet (1 mg total) by mouth 3 (three) times daily as needed for anxiety.  15 tablet  0  . Tetrahydrozoline HCl (VISINE OP) Apply 1-2 drops to eye daily as needed (dry, irritated eyes).       No current facility-administered medications on file prior to visit.   Family History  Problem Relation Age of Onset  . Hypertension Maternal Grandmother   . Hypertension Maternal Grandfather   . Heart disease Maternal Grandfather   . Hypertension Paternal Grandmother   . Hypertension Paternal Grandfather   . Cancer Paternal Grandfather   . Hypertension Mother    History   Social History  . Marital Status: Single    Spouse Name: N/A    Number of Children: N/A  . Years of Education: N/A   Occupational History  . Not on file.   Social History Main Topics  . Smoking status: Current Every Day Smoker -- 1.00 packs/day for 3 years    Types: Cigarettes  . Smokeless tobacco: Never Used  . Alcohol Use: No     Comment: occasional  . Drug Use: Yes  Special: Marijuana, Hydromorphone, Hydrocodone, Heroin     Comment:  Heroin - 3 month  . Sexual Activity: Not on file   Other Topics Concern  . Not on file   Social History Narrative  . No narrative on file    Review of Systems: Constitutional: Negative for fever, chills, diaphoresis, activity change, appetite change and fatigue. HENT: Negative for ear pain, nosebleeds, congestion, facial swelling, rhinorrhea, neck pain, neck stiffness and ear discharge.  Eyes: Negative for pain, discharge, redness, itching and visual disturbance. Respiratory: Negative for cough, choking, chest tightness, shortness of breath, wheezing and stridor.  Cardiovascular: Negative for chest pain, palpitations and leg swelling. Gastrointestinal: Negative for abdominal  distention. Genitourinary: Negative for dysuria, urgency, frequency, hematuria, flank pain, decreased urine volume, difficulty urinating and dyspareunia.  Musculoskeletal: Negative for back pain, joint swelling, arthralgia and gait problem. Neurological: Negative for dizziness, tremors, seizures, syncope, facial asymmetry, speech difficulty, weakness, light-headedness, numbness and headaches.  Hematological: Negative for adenopathy. Does not bruise/bleed easily. Psychiatric/Behavioral: Negative for hallucinations, behavioral problems, confusion, dysphoric mood, decreased concentration and agitation.    Objective:   Filed Vitals:   06/16/13 1526  BP: 124/86  Pulse: 99  Temp: 98.8 F (37.1 C)  Resp: 17    Physical Exam: Constitutional: Patient appears well-developed and well-nourished. No distress. HENT: Normocephalic, atraumatic, External right and left ear normal. Oropharynx is clear and moist.  Eyes: Conjunctivae and EOM are normal. PERRLA, no scleral icterus. Neck: Normal ROM. Neck supple. No JVD. No tracheal deviation. No thyromegaly. CVS: RRR, S1/S2 +, no murmurs, no gallops, no carotid bruit.  Pulmonary: Effort and breath sounds normal, no stridor, rhonchi, wheezes, rales.  Abdominal: Soft. BS +, no distension, tenderness, rebound or guarding.  Musculoskeletal: Normal range of motion. No edema and no tenderness.  Neuro: Alert. Normal reflexes, muscle tone coordination. No cranial nerve deficit. Skin: Skin is warm and dry. No rash noted. Not diaphoretic. No erythema. No pallor. Psychiatric: Normal mood and affect. Behavior, judgment, thought content normal.  Lab Results  Component Value Date   WBC 15.5* 04/29/2013   HGB 17.5* 04/29/2013   HCT 48.2 04/29/2013   MCV 86.1 04/29/2013   PLT 298 04/29/2013   Lab Results  Component Value Date   CREATININE 0.86 04/29/2013   BUN 13 04/29/2013   NA 136* 04/29/2013   K 3.6* 04/29/2013   CL 90* 04/29/2013   CO2 29 04/29/2013    No results found  for this basename: HGBA1C   Lipid Panel  No results found for this basename: chol, trig, hdl, cholhdl, vldl, ldlcalc       Assessment and plan:   1. Anxiety state, unspecified/2. Depression Have started patient back on Paxil. - Ambulatory referral to Psychiatry - PARoxetine (PAXIL) 20 MG tablet; Take 1 tablet (20 mg total) by mouth daily.  Dispense: 30 tablet; Refill: 1   3. Seizures I have referred patient to neurology for further evaluation and management.  4. Hepatitis C  - Ambulatory referral to Infectious Disease  5. Smoking Advised patient to quit smoking, is to try nicotine patch. - nicotine (NICODERM CQ) 21 mg/24hr patch; Place 1 patch (21 mg total) onto the skin daily.  Dispense: 28 patch; Refill: 0  6. Screening Baseline blood work.  - CBC with Differential - COMPLETE METABOLIC PANEL WITH GFR - TSH - Lipid panel - Vit D  25 hydroxy (rtn osteoporosis monitoring)  7. History of substance abuse As per patient he used heroin 3 months ago.  Return in about 3 months (around 09/16/2013) for anxiety, depression, Child psychotherapistocial Worker Visit.   Doris Cheadleeepak Kilani Joffe, MD

## 2013-06-16 NOTE — Progress Notes (Signed)
Patient here with grandmother Here for hospital follow up-seizures

## 2013-06-17 ENCOUNTER — Telehealth: Payer: Self-pay

## 2013-06-17 ENCOUNTER — Ambulatory Visit: Payer: Self-pay | Attending: Internal Medicine

## 2013-06-17 LAB — CBC WITH DIFFERENTIAL/PLATELET
BASOS ABS: 0.1 10*3/uL (ref 0.0–0.1)
Basophils Relative: 1 % (ref 0–1)
Eosinophils Absolute: 0.3 10*3/uL (ref 0.0–0.7)
Eosinophils Relative: 5 % (ref 0–5)
HEMATOCRIT: 39.1 % (ref 39.0–52.0)
Hemoglobin: 13.1 g/dL (ref 13.0–17.0)
LYMPHS PCT: 35 % (ref 12–46)
Lymphs Abs: 2.3 10*3/uL (ref 0.7–4.0)
MCH: 28.7 pg (ref 26.0–34.0)
MCHC: 33.5 g/dL (ref 30.0–36.0)
MCV: 85.6 fL (ref 78.0–100.0)
Monocytes Absolute: 0.7 10*3/uL (ref 0.1–1.0)
Monocytes Relative: 10 % (ref 3–12)
NEUTROS ABS: 3.2 10*3/uL (ref 1.7–7.7)
Neutrophils Relative %: 49 % (ref 43–77)
Platelets: 261 10*3/uL (ref 150–400)
RBC: 4.57 MIL/uL (ref 4.22–5.81)
RDW: 14.5 % (ref 11.5–15.5)
WBC: 6.6 10*3/uL (ref 4.0–10.5)

## 2013-06-17 LAB — COMPLETE METABOLIC PANEL WITH GFR
ALBUMIN: 4 g/dL (ref 3.5–5.2)
ALT: 111 U/L — AB (ref 0–53)
AST: 77 U/L — AB (ref 0–37)
Alkaline Phosphatase: 72 U/L (ref 39–117)
BUN: 7 mg/dL (ref 6–23)
CALCIUM: 9.3 mg/dL (ref 8.4–10.5)
CHLORIDE: 96 meq/L (ref 96–112)
CO2: 28 mEq/L (ref 19–32)
Creat: 0.72 mg/dL (ref 0.50–1.35)
GFR, Est African American: >89 mL/min
GFR, Est Non African American: >89 mL/min
Glucose, Bld: 86 mg/dL (ref 70–99)
Potassium: 4 mEq/L (ref 3.5–5.3)
Sodium: 135 mEq/L (ref 135–145)
Total Bilirubin: 0.9 mg/dL (ref 0.2–1.2)
Total Protein: 7 g/dL (ref 6.0–8.3)

## 2013-06-17 LAB — LIPID PANEL
CHOL/HDL RATIO: 3.9 ratio
Cholesterol: 151 mg/dL (ref 0–200)
HDL: 39 mg/dL — ABNORMAL LOW (ref >39)
LDL Cholesterol: 98 mg/dL (ref 0–99)
Triglycerides: 68 mg/dL (ref <150)
VLDL: 14 mg/dL (ref 0–40)

## 2013-06-17 LAB — TSH: TSH: 1.389 u[IU]/mL (ref 0.350–4.500)

## 2013-06-17 LAB — VITAMIN D 25 HYDROXY (VIT D DEFICIENCY, FRACTURES): VIT D 25 HYDROXY: 34 ng/mL (ref 30–89)

## 2013-06-17 NOTE — Telephone Encounter (Signed)
Placed call to patient to inform of lab results. Unable to reach, left voicemail requesting return phone call.

## 2013-06-17 NOTE — Telephone Encounter (Signed)
Message copied by Marykay LexBECK, Inri Sobieski K on Wed Jun 17, 2013  5:10 PM ------      Message from: Doris CheadleADVANI, DEEPAK      Created: Wed Jun 17, 2013 10:09 AM       Call and let the patient know that most of his blood work is normal except for abnormal liver function test( secondary to hepatitis C) patient has already been referred to ID. ------

## 2013-06-23 ENCOUNTER — Other Ambulatory Visit: Payer: Self-pay

## 2013-06-25 ENCOUNTER — Other Ambulatory Visit (INDEPENDENT_AMBULATORY_CARE_PROVIDER_SITE_OTHER): Payer: Self-pay

## 2013-06-25 DIAGNOSIS — B1920 Unspecified viral hepatitis C without hepatic coma: Secondary | ICD-10-CM

## 2013-06-29 LAB — HCV RNA QUANT RFLX ULTRA OR GENOTYP
HCV QUANT: 129640 [IU]/mL — AB (ref <15)
HCV Quantitative Log: 5.11 {Log} — ABNORMAL HIGH (ref <1.18)

## 2013-06-30 LAB — HEPATITIS C GENOTYPE: HCV Genotype: 2

## 2013-09-07 ENCOUNTER — Ambulatory Visit (INDEPENDENT_AMBULATORY_CARE_PROVIDER_SITE_OTHER): Payer: Self-pay | Admitting: Internal Medicine

## 2013-09-07 ENCOUNTER — Encounter: Payer: Self-pay | Admitting: Internal Medicine

## 2013-09-07 VITALS — BP 112/76 | HR 97 | Temp 97.8°F | Ht 70.0 in | Wt 187.0 lb

## 2013-09-07 DIAGNOSIS — B182 Chronic viral hepatitis C: Secondary | ICD-10-CM

## 2013-09-07 NOTE — Progress Notes (Signed)
+Lawrence Lacretia NicksW Lawrence [name] is a 28 y.o. male who presents for initial evaluation and management of a positive Hepatitis C antibody test.  Patient tested positive 3 years ago after using IV drugs. Test was performed as part of an evaluation of known exposure to hepatitis C. Hepatitis C risk factors present are: IV drug abuse (details: last used eariler this year). Patient denies history of blood transfusion, multiple sexual partners, sexual contact with person with liver disease, tattoos. Patient has not had other studies performed. Results: hepatitis C RNA by PCR, result: positive. Patient has not had prior treatment for Hepatitis C. Patient does not have a past history of liver disease. Patient does not have a family history of liver disease.   HPI: Recent history of IV heroin.  Now in a treatment program and hoping to get clean.  Also trying to get help for his anxiety and depression.  Started on Paxil by his PCP.  Has genotype 2.  Other labs pending.    Patient does not have documented immunity to Hepatitis A. Patient does not have documented immunity to Hepatitis B.     Review of Systems A comprehensive review of systems was negative.   Past Medical History  Diagnosis Date  . Depression   . New onset of headaches     post op 01/2010  . Nonspecific elevation of levels of transaminase or lactic acid dehydrogenase (LDH)     01/2010  . Anxiety   . Hepatitis C   . Back pain   . Shoulder pain, right   . 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 Renal disorder   . Seizure     History  Substance Use Topics  . Smoking status: Current Every Day Smoker -- 1.00 packs/day for 3 years    Types: Cigarettes  . Smokeless tobacco: Never Used     Comment: trying to cut back  . Alcohol Use: No     Comment: occasional    Family History  Problem Relation Age of Onset  . Hypertension Maternal Grandmother   . Hypertension Maternal Grandfather   . Heart disease Maternal Grandfather   .  Hypertension Paternal Grandmother   . Hypertension Paternal Grandfather   . Cancer Paternal Grandfather   . Hypertension Mother       Objective:   Filed Vitals:   09/07/13 1001  BP: 112/76  Pulse: 97  Temp: 97.8 F (36.6 C)   in no apparent distress and alert HEENT: anicteric Cor RRR and No murmurs clear Bowel sounds are normal, liver is not enlarged, spleen is not enlarged peripheral pulses normal, no pedal edema, no clubbing or cyanosis negative for - jaundice, spider hemangioma, telangiectasia, palmar erythema, ecchymosis and atrophy  Laboratory Genotype:  Lab Results  Component Value Date   HCVGENOTYPE 2 06/25/2013   HCV viral load:  Lab Results  Component Value Date   WBC 6.6 06/16/2013   HGB 13.1 06/16/2013   HCT 39.1 06/16/2013   MCV 85.6 06/16/2013   PLT 261 06/16/2013    Lab Results  Component Value Date   CREATININE 0.72 06/16/2013   BUN 7 06/16/2013   NA 135 06/16/2013   K 4.0 06/16/2013   CL 96 06/16/2013   CO2 28 06/16/2013    Lab Results  Component Value Date   ALT 111* 06/16/2013   AST 77* 06/16/2013   ALKPHOS 72 06/16/2013   BILITOT 0.9 06/16/2013     Assessment: Hepatitis C genotype 2  Plan: 1) Patient counseled extensively on limiting acetaminophen to no more than 2 grams daily, avoidance of alcohol. 2) Transmission discussed with patient including sexual transmission, sharing razors and toothbrush.   3) Will need referral to gastroenterology: No.unless indicated.   4) Will need referral for substance abuse counseling: Yes.  He will need to show good follow up to be considered for treatment in the future.   5) Will prescribe sofosbuvir and weight based ribavirin or potentially daclatasvir if he completes a substance abuse program and remains stable.  Counseled the patient on side effects and monitoring.   6) Follow up next month after elastography and monitor drug abuse progress.  Check A and B titers for ? Vaccination

## 2013-09-08 ENCOUNTER — Telehealth: Payer: Self-pay | Admitting: *Deleted

## 2013-09-08 NOTE — Telephone Encounter (Signed)
Attempted to notify patient that his ultrasound appointment had been changed from 09/14/13 to 09/17/13 because it was an error with scheduling and this particular ultrasound is not done on Mondays. Unable to reach patient after several attempts. Left message on voicemail of emergency contact, grandfather who came with him to his appointment that the ultrasound was changed to 09/17/13 for the same time, 8:00 AM.

## 2013-09-14 ENCOUNTER — Ambulatory Visit (HOSPITAL_COMMUNITY): Payer: Self-pay

## 2013-09-16 ENCOUNTER — Ambulatory Visit: Payer: Self-pay | Admitting: Internal Medicine

## 2013-09-17 ENCOUNTER — Ambulatory Visit (HOSPITAL_COMMUNITY): Payer: Self-pay

## 2013-11-02 ENCOUNTER — Telehealth: Payer: Self-pay | Admitting: *Deleted

## 2013-11-02 NOTE — Telephone Encounter (Signed)
LCSW spoke with grandmother on the phone about referrals for patient.  LCSW provided grandmother with information for psychiatry referral.  [facility] and [facility]. Grandmother also wanted follow up information about patient's referral to neurology.  LCSW will follow up with referral coordinator.  However, documentation show that referral was made but patient did not return contacts.  Lawrence [name] MSW, LCSW

## 2013-11-09 ENCOUNTER — Telehealth: Payer: Self-pay | Admitting: *Deleted

## 2013-11-09 NOTE — Telephone Encounter (Signed)
Grandmother called stated that she was very concerned about wanted to get a referral for her son to see psychiatry.  LCSW explained to the grandmother how the psychiatry service process works. Grandmother expressed disappointment and stated that she felt that the patient was not able to go to a walk in appointment because of the anxiety he would experience by being in a crowd of people.  LCSW did some additional research and found that if patient called the [facility] they can make an appointment for him once he completed a phone assessment. Patient's grandmother stated that she would try that. LCS[facility] also [facility]. LCSW observed grandmother's distress level and recommended that she seek support with a Family Support group.  Beverly Sessionsywan J Anabela Crayton MSW, LCSW

## 2013-12-20 ENCOUNTER — Emergency Department (INDEPENDENT_AMBULATORY_CARE_PROVIDER_SITE_OTHER)
Admission: EM | Admit: 2013-12-20 | Discharge: 2013-12-20 | Disposition: A | Discharge status: Home / Self Care | Payer: No Typology Code available for payment source | Source: Home / Self Care | Attending: Family Medicine | Admitting: Family Medicine

## 2013-12-20 ENCOUNTER — Encounter (HOSPITAL_COMMUNITY): Payer: Self-pay | Admitting: Emergency Medicine

## 2013-12-20 DIAGNOSIS — M546 Pain in thoracic spine: Secondary | ICD-10-CM

## 2013-12-20 DIAGNOSIS — M25511 Pain in right shoulder: Secondary | ICD-10-CM

## 2013-12-20 DIAGNOSIS — M542 Cervicalgia: Secondary | ICD-10-CM

## 2013-12-20 MED ORDER — DICLOFENAC SODIUM 75 MG PO TBEC: 75.0000 mg | DELAYED_RELEASE_TABLET | Freq: Two times a day (BID) | ORAL | Status: DC | PRN

## 2013-12-20 MED ORDER — CYCLOBENZAPRINE HCL 10 MG PO TABS: 10.0000 mg | ORAL_TABLET | Freq: Every evening | ORAL | Status: DC | PRN
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
## 2013-12-20 NOTE — Discharge Instructions (Signed)
Thank you for coming in today. Come back or go to the emergency room if you notice new weakness new numbness problems walking or bowel or bladder problems. Follow up with primary doctor.  Come back as needed.  Cervical Sprain A cervical sprain is an injury in the neck in which the strong, fibrous tissues (ligaments) that connect your neck bones stretch or tear. Cervical sprains can range from mild to severe. Severe cervical sprains can cause the neck vertebrae to be unstable. This can lead to damage of the spinal cord and can result in serious nervous system problems. The amount of time it takes for a cervical sprain to get better depends on the cause and extent of the injury. Most cervical sprains heal in 1 to 3 weeks. CAUSES  Severe cervical sprains may be caused by:   Contact sport injuries (such as from football, rugby, wrestling, hockey, auto racing, gymnastics, diving, martial arts, or boxing).   Motor vehicle collisions.   Whiplash injuries. This is an injury from a sudden forward and backward whipping movement of the head and neck.  Falls.  Mild cervical sprains may be caused by:   Being in an awkward position, such as while cradling a telephone between your ear and shoulder.   Sitting in a chair that does not offer proper support.   Working at a poorly Marketing executivedesigned computer station.   Looking up or down for long periods of time.  SYMPTOMS   Pain, soreness, stiffness, or a burning sensation in the front, back, or sides of the neck. This discomfort may develop immediately after the injury or slowly, 24 hours or more after the injury.   Pain or tenderness directly in the middle of the back of the neck.   Shoulder or upper back pain.   Limited ability to move the neck.   Headache.   Dizziness.   Weakness, numbness, or tingling in the hands or arms.   Muscle spasms.   Difficulty swallowing or chewing.   Tenderness and swelling of the neck.  DIAGNOSIS    Most of the time your health care provider can diagnose a cervical sprain by taking your history and doing a physical exam. Your health care provider will ask about previous neck injuries and any known neck problems, such as arthritis in the neck. X-rays may be taken to find out if there are any other problems, such as with the bones of the neck. Other tests, such as a CT scan or MRI, may also be needed.  TREATMENT  Treatment depends on the severity of the cervical sprain. Mild sprains can be treated with rest, keeping the neck in place (immobilization), and pain medicines. Severe cervical sprains are immediately immobilized. Further treatment is done to help with pain, muscle spasms, and other symptoms and may include:  Medicines, such as pain relievers, numbing medicines, or muscle relaxants.   Physical therapy. This may involve stretching exercises, strengthening exercises, and posture training. Exercises and improved posture can help stabilize the neck, strengthen muscles, and help stop symptoms from returning.  HOME CARE INSTRUCTIONS   Put ice on the injured area.   Put ice in a plastic bag.   Place a towel between your skin and the bag.   Leave the ice on for 15-20 minutes, 3-4 times a day.   If your injury was severe, you may have been given a cervical collar to wear. A cervical collar is a two-piece collar designed to keep your neck from moving while it  heals.  Do not remove the collar unless instructed by your health care provider.  If you have long hair, keep it outside of the collar.  Ask your health care provider before making any adjustments to your collar. Minor adjustments may be required over time to improve comfort and reduce pressure on your chin or on the back of your head.  Ifyou are allowed to remove the collar for cleaning or bathing, follow your health care provider's instructions on how to do so safely.  Keep your collar clean by wiping it with mild soap  and water and drying it completely. If the collar you have been given includes removable pads, remove them every 1-2 days and hand wash them with soap and water. Allow them to air dry. They should be completely dry before you wear them in the collar.  If you are allowed to remove the collar for cleaning and bathing, wash and dry the skin of your neck. Check your skin for irritation or sores. If you see any, tell your health care provider.  Do not drive while wearing the collar.   Only take over-the-counter or prescription medicines for pain, discomfort, or fever as directed by your health care provider.   Keep all follow-up appointments as directed by your health care provider.   Keep all physical therapy appointments as directed by your health care provider.   Make any needed adjustments to your workstation to promote good posture.   Avoid positions and activities that make your symptoms worse.   Warm up and stretch before being active to help prevent problems.  SEEK MEDICAL CARE IF:   Your pain is not controlled with medicine.   You are unable to decrease your pain medicine over time as planned.   Your activity level is not improving as expected.  SEEK IMMEDIATE MEDICAL CARE IF:   You develop any bleeding.  You develop stomach upset.  You have signs of an allergic reaction to your medicine.   Your symptoms get worse.   You develop new, unexplained symptoms.   You have numbness, tingling, weakness, or paralysis in any part of your body.  MAKE SURE YOU:   Understand these instructions.  Will watch your condition.  Will get help right away if you are not doing well or get worse. Document Released: 11/12/2006 Document Revised: 01/20/2013 Document Reviewed: 07/23/2012 [facility] Patient Information 2015 [employer] [place]. This information is not intended to replace advice given to you by your health care provider. Make sure you discuss any questions you have with  your health care provider.

## 2014-02-04 ENCOUNTER — Ambulatory Visit: Payer: Self-pay

## 2014-03-01 ENCOUNTER — Emergency Department (HOSPITAL_COMMUNITY)
Admission: EM | Admit: 2014-03-01 | Discharge: 2014-03-01 | Disposition: A | Discharge status: Home / Self Care | Payer: Self-pay | Attending: Emergency Medicine | Admitting: Emergency Medicine

## 2014-03-01 ENCOUNTER — Encounter (HOSPITAL_COMMUNITY): Payer: Self-pay | Admitting: Emergency Medicine

## 2014-03-01 DIAGNOSIS — Z79899 Other long term (current) drug therapy: Secondary | ICD-10-CM | POA: Insufficient documentation

## 2014-03-01 DIAGNOSIS — F419 Anxiety disorder, unspecified: Secondary | ICD-10-CM | POA: Insufficient documentation

## 2014-03-01 DIAGNOSIS — Z8619 Personal history of other infectious and parasitic diseases: Secondary | ICD-10-CM | POA: Insufficient documentation

## 2014-03-01 DIAGNOSIS — R51 Headache: Secondary | ICD-10-CM | POA: Insufficient documentation

## 2014-03-01 DIAGNOSIS — F329 Major depressive disorder, single episode, unspecified: Secondary | ICD-10-CM | POA: Insufficient documentation

## 2014-03-01 DIAGNOSIS — R519 Headache, unspecified: Secondary | ICD-10-CM

## 2014-03-01 DIAGNOSIS — R300 Dysuria: Secondary | ICD-10-CM | POA: Insufficient documentation

## 2014-03-01 DIAGNOSIS — Z87448 Personal history of other diseases of urinary system: Secondary | ICD-10-CM | POA: Insufficient documentation

## 2014-03-01 DIAGNOSIS — Z791 Long term (current) use of non-steroidal anti-inflammatories (NSAID): Secondary | ICD-10-CM | POA: Insufficient documentation

## 2014-03-01 LAB — URINALYSIS, ROUTINE W REFLEX MICROSCOPIC
Bilirubin Urine: NEGATIVE
Glucose, UA: NEGATIVE mg/dL
Hgb urine dipstick: NEGATIVE
Ketones, ur: NEGATIVE mg/dL
Leukocytes, UA: NEGATIVE
Nitrite: NEGATIVE
PH: 6 (ref 5.0–8.0)
Protein, ur: NEGATIVE mg/dL
Specific Gravity, Urine: 1.011 (ref 1.005–1.030)
Urobilinogen, UA: 0.2 mg/dL (ref 0.0–1.0)
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
## 2014-03-01 NOTE — ED Notes (Signed)
MD at bedside. 

## 2014-03-01 NOTE — ED Notes (Signed)
Pt reports midline lower back pain for the past 2 weeks. Pain worse with bending and urination. Pt reports fever at home for past 2 days. Reports dark foul smelling urine. Also reports L sided decreased sensation below hair line on top of head.

## 2014-03-01 NOTE — Discharge Instructions (Signed)
Ibuprofen 600 mg 3 times daily for the next 5 days.  Drink plenty of fluids.  Follow-up with your primary Dr. if not improving in the next week, and return to the ER if your symptoms substantially worsen or change.   Dysuria Dysuria is the medical term for pain with urination. There are many causes for dysuria, but urinary tract infection is the most common. If a urinalysis was performed it can show that there is a urinary tract infection. A urine culture confirms that you or your child is sick. You will need to follow up with a healthcare provider because:  If a urine culture was done you will need to know the culture results and treatment recommendations.  If the urine culture was positive, you or your child will need to be put on antibiotics or know if the antibiotics prescribed are the right antibiotics for your urinary tract infection.  If the urine culture is negative (no urinary tract infection), then other causes may need to be explored or antibiotics need to be stopped. Today laboratory work may have been done and there does not seem to be an infection. If cultures were done they will take at least 24 to 48 hours to be completed. Today x-rays may have been taken and they read as normal. No cause can be found for the problems. The x-rays may be re-read by a radiologist and you will be contacted if additional findings are made. You or your child may have been put on medications to help with this problem until you can see your primary caregiver. If the problems get better, see your primary caregiver if the problems return. If you were given antibiotics (medications which kill germs), take all of the mediations as directed for the full course of treatment.  If laboratory work was done, you need to find the results. Leave a telephone number where you can be reached. If this is not possible, make sure you find out how you are to get test results. HOME CARE INSTRUCTIONS   Drink lots of fluids.  For adults, drink eight, 8 ounce glasses of clear juice or water a day. For children, replace fluids as suggested by your caregiver.  Empty the bladder often. Avoid holding urine for long periods of time.  After a bowel movement, women should cleanse front to back, using each tissue only once.  Empty your bladder before and after sexual intercourse.  Take all the medicine given to you until it is gone. You may feel better in a few days, but TAKE ALL MEDICINE.  Avoid caffeine, tea, alcohol and carbonated beverages, because they tend to irritate the bladder.  In men, alcohol may irritate the prostate.  Only take over-the-counter or prescription medicines for pain, discomfort, or fever as directed by your caregiver.  If your caregiver has given you a follow-up appointment, it is very important to keep that appointment. Not keeping the appointment could result in a chronic or permanent injury, pain, and disability. If there is any problem keeping the appointment, you must call back to this facility for assistance. SEEK IMMEDIATE MEDICAL CARE IF:   Back pain develops.  A fever develops.  There is nausea (feeling sick to your stomach) or vomiting (throwing up).  Problems are no better with medications or are getting worse. MAKE SURE YOU:   Understand these instructions.  Will watch your condition.  Will get help right away if you are not doing well or get worse. Document Released: 10/14/2003 Document Revised:  04/09/2011 Document Reviewed: 08/21/2007 [employer] Patient Information 2015 [place], [place]. This information is not intended to replace advice given to you by your health care provider. Make sure you discuss any questions you have with your health care provider.  General Headache Without Cause A headache is pain or discomfort felt around the head or neck area. The specific cause of a headache may not be found. There are many causes and types of headaches. A few common ones  are:  Tension headaches.  Migraine headaches.  Cluster headaches.  Chronic daily headaches. HOME CARE INSTRUCTIONS   Keep all follow-up appointments with your caregiver or any specialist referral.  Only take over-the-counter or prescription medicines for pain or discomfort as directed by your caregiver.  Lie down in a dark, quiet room when you have a headache.  Keep a headache journal to find out what may trigger your migraine headaches. For example, write down:  What you eat and drink.  How much sleep you get.  Any change to your diet or medicines.  Try massage or other relaxation techniques.  Put ice packs or heat on the head and neck. Use these 3 to 4 times per day for 15 to 20 minutes each time, or as needed.  Limit stress.  Sit up straight, and do not tense your muscles.  Quit smoking if you smoke.  Limit alcohol use.  Decrease the amount of caffeine you drink, or stop drinking caffeine.  Eat and sleep on a regular schedule.  Get 7 to 9 hours of sleep, or as recommended by your caregiver.  Keep lights dim if bright lights bother you and make your headaches worse. SEEK MEDICAL CARE IF:   You have problems with the medicines you were prescribed.  Your medicines are not working.  You have a change from the usual headache.  You have nausea or vomiting. SEEK IMMEDIATE MEDICAL CARE IF:   Your headache becomes severe.  You have a fever.  You have a stiff neck.  You have loss of vision.  You have muscular weakness or loss of muscle control.  You start losing your balance or have trouble walking.  You feel faint or pass out.  You have severe symptoms that are different from your first symptoms. MAKE SURE YOU:   Understand these instructions.  Will watch your condition.  Will get help right away if you are not doing well or get worse. Document Released: 01/15/2005 Document Revised: 04/09/2011 Document Reviewed: 01/31/2011 [facility] Patient  Information 2015 [place], [place]. This information is not intended to replace advice given to you by your health care provider. Make sure you discuss any questions you have with your health care provider.

## 2014-04-02 ENCOUNTER — Ambulatory Visit: Payer: Self-pay

## 2014-08-30 ENCOUNTER — Encounter (HOSPITAL_COMMUNITY): Payer: Self-pay | Admitting: Emergency Medicine

## 2014-08-30 ENCOUNTER — Emergency Department (HOSPITAL_COMMUNITY): Payer: Self-pay

## 2014-08-30 ENCOUNTER — Emergency Department (HOSPITAL_COMMUNITY)
Admission: EM | Admit: 2014-08-30 | Discharge: 2014-08-30 | Disposition: A | Discharge status: Home / Self Care | Payer: Self-pay | Attending: Emergency Medicine | Admitting: Emergency Medicine

## 2014-08-30 DIAGNOSIS — F1310 Sedative, hypnotic or anxiolytic abuse, uncomplicated: Secondary | ICD-10-CM | POA: Insufficient documentation

## 2014-08-30 DIAGNOSIS — Z79891 Long term (current) use of opiate analgesic: Secondary | ICD-10-CM | POA: Insufficient documentation

## 2014-08-30 DIAGNOSIS — R109 Unspecified abdominal pain: Secondary | ICD-10-CM

## 2014-08-30 DIAGNOSIS — Z8619 Personal history of other infectious and parasitic diseases: Secondary | ICD-10-CM | POA: Insufficient documentation

## 2014-08-30 DIAGNOSIS — Z79899 Other long term (current) drug therapy: Secondary | ICD-10-CM | POA: Insufficient documentation

## 2014-08-30 DIAGNOSIS — G8929 Other chronic pain: Secondary | ICD-10-CM | POA: Insufficient documentation

## 2014-08-30 DIAGNOSIS — Z86018 Personal history of other benign neoplasm: Secondary | ICD-10-CM | POA: Insufficient documentation

## 2014-08-30 DIAGNOSIS — Z87442 Personal history of urinary calculi: Secondary | ICD-10-CM | POA: Insufficient documentation

## 2014-08-30 DIAGNOSIS — N390 Urinary tract infection, site not specified: Secondary | ICD-10-CM | POA: Insufficient documentation

## 2014-08-30 DIAGNOSIS — F1410 Cocaine abuse, uncomplicated: Secondary | ICD-10-CM | POA: Insufficient documentation

## 2014-08-30 DIAGNOSIS — Z720 Tobacco use: Secondary | ICD-10-CM | POA: Insufficient documentation

## 2014-08-30 DIAGNOSIS — F329 Major depressive disorder, single episode, unspecified: Secondary | ICD-10-CM | POA: Insufficient documentation

## 2014-08-30 DIAGNOSIS — R000 Tachycardia, unspecified: Secondary | ICD-10-CM | POA: Insufficient documentation

## 2014-08-30 DIAGNOSIS — F419 Anxiety disorder, unspecified: Secondary | ICD-10-CM | POA: Insufficient documentation

## 2014-08-30 LAB — CBC
HCT: 46 % (ref 39.0–52.0)
HEMOGLOBIN: 16.2 g/dL (ref 13.0–17.0)
MCH: 30.9 pg (ref 26.0–34.0)
MCHC: 35.2 g/dL (ref 30.0–36.0)
MCV: 87.6 fL (ref 78.0–100.0)
PLATELETS: 181 10*3/uL (ref 150–400)
RBC: 5.25 MIL/uL (ref 4.22–5.81)
RDW: 13.4 % (ref 11.5–15.5)
WBC: 6.5 10*3/uL (ref 4.0–10.5)

## 2014-08-30 LAB — URINALYSIS, ROUTINE W REFLEX MICROSCOPIC
Glucose, UA: NEGATIVE mg/dL
Ketones, ur: 15 mg/dL — AB
Nitrite: POSITIVE — AB
Protein, ur: 100 mg/dL — AB
Specific Gravity, Urine: 1.027 (ref 1.005–1.030)
Urobilinogen, UA: 1 mg/dL (ref 0.0–1.0)
pH: 6 (ref 5.0–8.0)

## 2014-08-30 LAB — BASIC METABOLIC PANEL
Anion gap: 10 (ref 5–15)
BUN: 9 mg/dL (ref 6–20)
CALCIUM: 9.1 mg/dL (ref 8.9–10.3)
CO2: 22 mmol/L (ref 22–32)
Chloride: 107 mmol/L (ref 101–111)
Creatinine, Ser: 1.16 mg/dL (ref 0.61–1.24)
GFR calc non Af Amer: >60 mL/min (ref >60)
Glucose, Bld: 97 mg/dL (ref 65–99)
POTASSIUM: 3.8 mmol/L (ref 3.5–5.1)
Sodium: 139 mmol/L (ref 135–145)

## 2014-08-30 LAB — URINE MICROSCOPIC-ADD ON

## 2014-08-30 LAB — RAPID URINE DRUG SCREEN, HOSP PERFORMED
Amphetamines: NOT DETECTED
Barbiturates: NOT DETECTED
Benzodiazepines: POSITIVE — AB
Cocaine: POSITIVE — AB
Opiates: NOT DETECTED
TETRAHYDROCANNABINOL: NOT DETECTED

## 2014-08-30 LAB — LIPASE, BLOOD: Lipase: 30 U/L (ref 22–51)

## 2014-08-30 MED ORDER — ONDANSETRON HCL 4 MG/2ML IJ SOLN
4.0000 mg | Freq: Once | INTRAMUSCULAR | Status: AC
  Administered 2014-08-30: 4 mg via INTRAVENOUS
  Filled 2014-08-30: qty 2

## 2014-08-30 MED ORDER — CLONIDINE HCL 0.1 MG PO TABS: ORAL_TABLET | ORAL | Status: DC

## 2014-08-30 MED ORDER — ONDANSETRON HCL 4 MG/2ML IJ SOLN
4.0000 mg | Freq: Once | INTRAMUSCULAR | Status: AC
Start: 2014-08-30 — End: 2014-08-30
  Administered 2014-08-30: 4 mg via INTRAVENOUS
  Filled 2014-08-30: qty 2

## 2014-08-30 MED ORDER — DEXTROSE 5 % IV SOLN
1.0000 g | Freq: Once | INTRAVENOUS | Status: AC
  Administered 2014-08-30: 1 g via INTRAVENOUS
  Filled 2014-08-30: qty 10

## 2014-08-30 MED ORDER — CEPHALEXIN 500 MG PO CAPS: 1000.0000 mg | ORAL_CAPSULE | Freq: Two times a day (BID) | ORAL | Status: DC

## 2014-08-30 MED ORDER — KETOROLAC TROMETHAMINE 30 MG/ML IJ SOLN
30.0000 mg | Freq: Once | INTRAMUSCULAR | Status: AC
  Administered 2014-08-30: 30 mg via INTRAVENOUS
  Filled 2014-08-30: qty 1

## 2014-08-30 MED ORDER — CLONIDINE HCL 0.2 MG PO TABS
0.2000 mg | ORAL_TABLET | Freq: Once | ORAL | Status: AC
  Administered 2014-08-30: 0.2 mg via ORAL
  Filled 2014-08-30: qty 1

## 2014-08-30 MED ORDER — MORPHINE SULFATE 4 MG/ML IJ SOLN
6.0000 mg | Freq: Once | INTRAMUSCULAR | Status: AC
  Administered 2014-08-30: 6 mg via INTRAVENOUS
  Filled 2014-08-30: qty 2

## 2014-08-30 MED ORDER — SODIUM CHLORIDE 0.9 % IV BOLUS (SEPSIS)
1000.0000 mL | Freq: Once | INTRAVENOUS | Status: AC
  Administered 2014-08-30: 1000 mL via INTRAVENOUS

## 2014-08-30 NOTE — ED Notes (Signed)
Pt arrived in room

## 2014-08-30 NOTE — Discharge Instructions (Signed)
You have been evaluated for a right flank pain. You do have Multiple stones in the kidney but it is not blocking the ureter at this time. Urine shows signs of a urinary tract infection. Please take antibiotic as prescribed. Follow up closely with urologist for further care. Find a primary care provider by using resources below.  Urinary Tract Infection Urinary tract infections (UTIs) can develop anywhere along your urinary tract. Your urinary tract is your body's drainage system for removing wastes and extra water. Your urinary tract includes two kidneys, two ureters, a bladder, and a urethra. Your kidneys are a pair of bean-shaped organs. Each kidney is about the size of your fist. They are located below your ribs, one on each side of your spine. CAUSES Infections are caused by microbes, which are microscopic organisms, including fungi, viruses, and bacteria. These organisms are so small that they can only be seen through a microscope. Bacteria are the microbes that most commonly cause UTIs. SYMPTOMS  Symptoms of UTIs may vary by age and gender of the patient and by the location of the infection. Symptoms in young women typically include a frequent and intense urge to urinate and a painful, burning feeling in the bladder or urethra during urination. Older women and men are more likely to be tired, shaky, and weak and have muscle aches and abdominal pain. A fever may mean the infection is in your kidneys. Other symptoms of a kidney infection include pain in your back or sides below the ribs, nausea, and vomiting. DIAGNOSIS To diagnose a UTI, your caregiver will ask you about your symptoms. Your caregiver also will ask to provide a urine sample. The urine sample will be tested for bacteria and white blood cells. White blood cells are made by your body to help fight infection. TREATMENT  Typically, UTIs can be treated with medication. Because most UTIs are caused by a bacterial infection, they usually can  be treated with the use of antibiotics. The choice of antibiotic and length of treatment depend on your symptoms and the type of bacteria causing your infection. HOME CARE INSTRUCTIONS  If you were prescribed antibiotics, take them exactly as your caregiver instructs you. Finish the medication even if you feel better after you have only taken some of the medication.  Drink enough water and fluids to keep your urine clear or pale yellow.  Avoid caffeine, tea, and carbonated beverages. They tend to irritate your bladder.  Empty your bladder often. Avoid holding urine for long periods of time.  Empty your bladder before and after sexual intercourse.  After a bowel movement, women should cleanse from front to back. Use each tissue only once. SEEK MEDICAL CARE IF:   You have back pain.  You develop a fever.  Your symptoms do not begin to resolve within 3 days. SEEK IMMEDIATE MEDICAL CARE IF:   You have severe back pain or lower abdominal pain.  You develop chills.  You have nausea or vomiting.  You have continued burning or discomfort with urination. MAKE SURE YOU:   Understand these instructions.  Will watch your condition.  Will get help right away if you are not doing well or get worse. Document Released: 10/25/2004 Document Revised: 07/17/2011 Document Reviewed: 02/23/2011 [facility] Patient Information 2015 [place], [place]. This information is not intended to replace advice given to you by your health care provider. Make sure you discuss any questions you have with your health care provider.   Emergency Department Resource Guide 1) Find a  Doctor and Pay Out of Pocket Although you won't have to find out who is covered by your insurance plan, it is a good idea to ask around and get recommendations. You will then need to call the office and see if the doctor you have chosen will accept you as a new patient and what types of options they offer for patients who are self-pay. Some  doctors offer discounts or will set up payment plans for their patients who do not have insurance, but you will need to ask so you aren't surprised when you get to your appointment.  2) Contact Your Local Health Department Not all health departments have doctors that can see patients for sick visits, but many do, so it is worth a call to see if yours does. If you don't know where your local health department is, you can check in your phone book. The CDC also has a tool to help you locate your state's health department, and many state websites also have listings of all of their local health departments.  3) Find a Walk-in Clinic If your illness is not likely to be very severe or complicated, you may want to try a walk in clinic. These are popping up all over the country in pharmacies, drugstores, and shopping centers. They're usually staffed by nurse practitioners or physician assistants that have been trained to treat common illnesses and complaints. They're usually fairly quick and inexpensive. However, if you have serious medical issues or chronic medical problems, these are probably not your best option.  No Primary Care Doctor: - Call Health Connect at  [phone] - they can help you locate a primary care doctor that  accepts your insurance, provides certain services, etc. - Physician Referral Service- [phone]  Chronic Pain Problems: Organization         Address  Phone   Notes  [facility]  [phone] Patients need to be referred by their primary care doctor.   Medication Assistance: Organization         Address  Phone   Notes  [facility] Medication [facility] [address] [place] [place], [place] [address] [phone] --Must be a resident of [facility] -- Must have NO insurance coverage whatsoever (no Medicaid/ Medicare, etc.) -- The pt. MUST have a primary care doctor that directs their care regularly and follows them in the  community   [facility]ist  [phone]   [employer]  [phone]    Agencies that provide inexpensive medical care: Organization         Address  Phone   Notes  [facility]  [phone]   [facility]    [phone]   [facility] [address] [place], [place] [address] [phone]   [facility] [address], [place] [phone]   Planned Parenthood    [phone]   [facility]    [phone]   [facility]  [address], [place] Phone:  [phone], Fax:  [phone] Hours of Operation:  9 am - 6 pm, M-F.  Also accepts Medicaid/Medicare and self-pay.  [facility]  [address], [place] Phone: [phone], Fax: [phone]. Hours of Operation:  8:30 am - 5:30 pm, M-F.  Also accepts Medicaid and self-pay.  [facility] [place] [address] [employer] Phone: [phone])  [phone]   [facility] [address] [place], [place] [phone], Ext. [phone] Mondays & Thursdays: 7-9 AM.  First 15 patients are seen on a first come, first serve basis.    Medicaid-accepting [facility] Providers:  Organization         Address  Phone   Notes  [facility] [address], [place] [phone] Also accepts self-pay patients.  [facility] [place] [address]urell Josephs [place], [place]  [phone]   [facility] [address], [place] [phone]   [facility] [address] [place] [phone]   Renaye Rakers [address], [place]   [phone] Only accepts [place] patients after they have their name applied to their card.   Self-Pay (no insurance) in [facility]:  Organization         Address  Phone   Notes  Sickle Cell Patients, [facility]  Internal Medicine [address] [place], [place] [phone]   [facility] [address] [place], [place] [phone]   [facility] [place]  [address], [place] [phone]   [facility]/Dr. Osei-Bonsu  [address] [place] or [address], [place] [phone] Phone number for both [place] and [place] locations is the same.  [facility] [address], [place] [phone]   [facility] [address], [place] or [address] [phone]   [facility] [address], [place] [phone], phone; [phone], fax Sees patients 1st and 3rd Saturday of every month.  Must not qualify for public or private insurance (i.e. Medicaid, Medicare, [place] Health Choice, Veterans' Benefits)  Household income should be no more than 200% of the poverty level The clinic cannot treat you if you are pregnant or think you are pregnant  Sexually transmitted diseases are not treated at the clinic.    Dental Care: Organization         Address  Phone  Notes  [facility] Department of [facility] [address] [place], [place] [phone] Accepts children up to age 30 who are enrolled in [place] or [place] Health Choice; pregnant women with a Medicaid card; and children who have applied for Medicaid or [place] Health Choice, but were declined, whose parents can pay a reduced fee at time of service.  [facility] Department of [facility]  [address], [place] [phone] Accepts children up to age 6 who are enrolled in [place] or [place] Health Choice; pregnant women with a Medicaid card; and children who have applied for Medicaid or [place] Health Choice, but were declined, whose parents can pay a reduced fee at time of service.  [facility]  [address] [place], [place] [phone] Patients are seen by appointment only. Walk-ins are not accepted. [facility] will see patients 24 years of age and older. Monday - Tuesday (8am-5pm) Most Wednesdays (8:30-5pm) $30 per visit, cash only  [facility] Adult [facility]  [address], [facility] [phone] Patients are seen by appointment only. Walk-ins are not accepted. [facility] will see patients 12 years of age and older. One Wednesday Evening (Monthly: Volunteer Based).  $  30 per visit, cash only  [employer] of Dentistry Clinics  [phone] for adults; Children under age 75, call Graduate Pediatric Dentistry at [phone]. Children aged 83-14, please call [phone] to request a pediatric application.  Dental services are provided in all areas of dental care including fillings, crowns and bridges, complete and partial dentures, implants, gum treatment, root canals, and extractions. Preventive care is also provided. Treatment is provided to both adults and children. Patients are selected via a lottery and there is often a waiting list.   [facility] [address], [place]  [phone] [email or web address]   [facility] [address] [place], [place] [phone], Ext. [phone] Second and Fourth Thursday of each month, opens at 6:30 AM; Clinic ends at 9 AM.  Patients are seen on a first-come first-served basis, and a limited number are seen during each clinic.   [facility]  [address] [place], [place] [phone]   Eligibility Requirements You must have lived in [place], [place], or [place] counties for at least the last three months.   You cannot be eligible for state or federal sponsored [place], including [employer], [place], or [employer].   You generally cannot be eligible for healthcare insurance through your employer.    How to apply: Eligibility screenings are held every  Tuesday and Wednesday afternoon from 1:00 pm until 4:00 pm. You do not need an appointment for the interview!  [facility] [address] [place], [place] [phone]   [facility] Health Department  [phone]   [facility] Health Department  [phone]   [facility]  [phone]    Behavioral Health Resources in the Community: Intensive Outpatient Programs Organization         Address  Phone  Notes  [facility] Services [address] [place], [place] [phone]   [facility] Outpatient [address] [place], [place] [phone]   [facility] [address] [place], [place]  [phone]   [facility] Mental Health [address],  [place], [place] [phone] or [phone]   Substance Abuse Resources Organization         Address  Phone  Notes  [facility]  [phone]   [facility]  [phone]   [facility]  [phone]   Floydene Flock  [phone]   Residential & Outpatient Substance Abuse Program  [phone]   Psychological Services Organization         Address  Phone  Notes  [facility] Behavioral Health  [phone]   [facility] Services  [phone]   [facility] Mental Health [address], [place] [phone] or [phone]    Mobile Crisis Teams Organization         Address  Phone  Notes  [facility], Mobile Crisis Care Unit  [phone]   [facility]  [address] [place], [place] [phone]   Doristine Locks [address] [place] [phone]    Self-Help/Support Groups Organization         Address  Phone             Notes  [facility] [place] - variety of support groups  336- I7437963 Call for more information  Narcotics Anonymous (NA), [facility] [address], [employer] [place]  2 meetings at this location    Statistician  Address  Phone  Notes  [facility] [address],    [place]  [phone]   [facility]  [address], [place] [phone], [place], [place] [phone]   [facility] [address], [place] [phone] Admissions: 8am-3pm M-F  [facility] [address]    [place], [place] [phone]   [facility] [address], [place], [place] [phone]   [facility] [address]  [place], [place] [phone]   [facility] - Intensive Outpatient [address], [place], [place] [phone]   [facility] [address]  [place], [place] [phone] or [phone]   [facility] ([facility]) [address] [place], [place] [phone] Accepts Medicaid  [facility] [address]  [place] [phone] Substance Abuse/Addiction Treatment   [facility] Resources Organization         Address  Phone  Notes  [facility]  [phone]) [phone]   Angie FavaJulie Brannon, PhD [address] [place], [place]   [phone](336) [phone] or [phone]) [phone]   [facility]   [address] [place], [place] [phone]) [phone]   [facility] [address], [place], [place] [phone]) [phone] Insurance/Medicaid/sponsorship through [facility]  [facility] [address]                                    [place], [place] [phone]) [phone] Therapy/tele-psych/case  [facility] [address].   [place], [place] [phone]) [phone]    Dr. Lolly MustacheArfeen  [phone]) [phone]   [facility]  [employer] [facility] 1) [address], [place] 2) [address], [place] 3)  [address], [place] [phone](336) [phone]  [phone](336) [phone]   [facility] Child Abuse Hotline [phone](336) 9121539237 or [phone](336) [phone] (After  Hours)

## 2014-08-30 NOTE — ED Notes (Signed)
Pt not in room.
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
## 2014-08-30 NOTE — ED Notes (Signed)
Prior to administering toradol, pt stated that it would not work

## 2014-08-30 NOTE — ED Notes (Signed)
Pt verbalizes understanding of d/c instructions and denies any further needs at this time. 

## 2014-08-30 NOTE — ED Notes (Signed)
Patient transported to CT 

## 2014-08-30 NOTE — ED Notes (Signed)
Pt. reports kidney stone pain at RLQ radiating to right groin  with hematuria onset today .

## 2014-09-01 LAB — URINE CULTURE: CULTURE: NO GROWTH

## 2014-09-13 DIAGNOSIS — R109 Unspecified abdominal pain: Secondary | ICD-10-CM | POA: Insufficient documentation

## 2014-09-13 DIAGNOSIS — Z720 Tobacco use: Secondary | ICD-10-CM | POA: Insufficient documentation

## 2014-09-14 ENCOUNTER — Encounter (HOSPITAL_COMMUNITY): Payer: Self-pay | Admitting: Emergency Medicine

## 2014-09-14 ENCOUNTER — Emergency Department (HOSPITAL_COMMUNITY)
Admission: EM | Admit: 2014-09-14 | Discharge: 2014-09-14 | Disposition: A | Discharge status: Home / Self Care | Payer: Self-pay | Attending: Emergency Medicine | Admitting: Emergency Medicine

## 2014-09-14 ENCOUNTER — Encounter (HOSPITAL_COMMUNITY): Payer: Self-pay

## 2014-09-14 ENCOUNTER — Emergency Department (HOSPITAL_COMMUNITY)
Admission: EM | Admit: 2014-09-14 | Discharge: 2014-09-14 | Discharge status: Against Medical Advice | Payer: Self-pay | Attending: Emergency Medicine | Admitting: Emergency Medicine

## 2014-09-14 ENCOUNTER — Emergency Department (HOSPITAL_COMMUNITY): Payer: Self-pay

## 2014-09-14 DIAGNOSIS — G40909 Epilepsy, unspecified, not intractable, without status epilepticus: Secondary | ICD-10-CM | POA: Insufficient documentation

## 2014-09-14 DIAGNOSIS — Z79899 Other long term (current) drug therapy: Secondary | ICD-10-CM | POA: Insufficient documentation

## 2014-09-14 DIAGNOSIS — F329 Major depressive disorder, single episode, unspecified: Secondary | ICD-10-CM | POA: Insufficient documentation

## 2014-09-14 DIAGNOSIS — Z8619 Personal history of other infectious and parasitic diseases: Secondary | ICD-10-CM | POA: Insufficient documentation

## 2014-09-14 DIAGNOSIS — F1210 Cannabis abuse, uncomplicated: Secondary | ICD-10-CM | POA: Insufficient documentation

## 2014-09-14 DIAGNOSIS — Z87448 Personal history of other diseases of urinary system: Secondary | ICD-10-CM | POA: Insufficient documentation

## 2014-09-14 DIAGNOSIS — F1310 Sedative, hypnotic or anxiolytic abuse, uncomplicated: Secondary | ICD-10-CM | POA: Insufficient documentation

## 2014-09-14 DIAGNOSIS — R52 Pain, unspecified: Secondary | ICD-10-CM

## 2014-09-14 DIAGNOSIS — F419 Anxiety disorder, unspecified: Secondary | ICD-10-CM | POA: Insufficient documentation

## 2014-09-14 DIAGNOSIS — R109 Unspecified abdominal pain: Secondary | ICD-10-CM | POA: Insufficient documentation

## 2014-09-14 DIAGNOSIS — Z720 Tobacco use: Secondary | ICD-10-CM | POA: Insufficient documentation

## 2014-09-14 LAB — I-STAT CHEM 8, ED
BUN: 10 mg/dL (ref 6–20)
Calcium, Ion: 1.14 mmol/L (ref 1.12–1.23)
Chloride: 97 mmol/L — ABNORMAL LOW (ref 101–111)
Creatinine, Ser: 0.9 mg/dL (ref 0.61–1.24)
GLUCOSE: 116 mg/dL — AB (ref 65–99)
HEMATOCRIT: 47 % (ref 39.0–52.0)
HEMOGLOBIN: 16 g/dL (ref 13.0–17.0)
POTASSIUM: 3.3 mmol/L — AB (ref 3.5–5.1)
Sodium: 140 mmol/L (ref 135–145)
TCO2: 30 mmol/L (ref 0–100)

## 2014-09-14 LAB — URINALYSIS, ROUTINE W REFLEX MICROSCOPIC
BILIRUBIN URINE: NEGATIVE
GLUCOSE, UA: NEGATIVE mg/dL
Hgb urine dipstick: NEGATIVE
KETONES UR: NEGATIVE mg/dL
Leukocytes, UA: NEGATIVE
Nitrite: NEGATIVE
PH: 6 (ref 5.0–8.0)
Protein, ur: NEGATIVE mg/dL
SPECIFIC GRAVITY, URINE: 1.001 — AB (ref 1.005–1.030)
Urobilinogen, UA: 0.2 mg/dL (ref 0.0–1.0)

## 2014-09-14 LAB — RAPID URINE DRUG SCREEN, HOSP PERFORMED
AMPHETAMINES: NOT DETECTED
BARBITURATES: NOT DETECTED
Benzodiazepines: POSITIVE — AB
Cocaine: NOT DETECTED
Opiates: NOT DETECTED
Tetrahydrocannabinol: POSITIVE — AB

## 2014-09-14 MED ORDER — PHENAZOPYRIDINE HCL 100 MG PO TABS
200.0000 mg | ORAL_TABLET | Freq: Once | ORAL | Status: AC
  Administered 2014-09-14: 200 mg via ORAL
  Filled 2014-09-14: qty 2

## 2014-09-14 MED ORDER — METHOCARBAMOL 500 MG PO TABS: 500.0000 mg | ORAL_TABLET | Freq: Two times a day (BID) | ORAL | Status: DC

## 2014-09-14 MED ORDER — HALOPERIDOL LACTATE 5 MG/ML IJ SOLN
2.0000 mg | Freq: Once | INTRAMUSCULAR | Status: AC
  Administered 2014-09-14: 2 mg via INTRAVENOUS
  Filled 2014-09-14: qty 1

## 2014-09-14 MED ORDER — DIPHENHYDRAMINE HCL 25 MG PO CAPS
25.0000 mg | ORAL_CAPSULE | Freq: Once | ORAL | Status: AC
Start: 2014-09-14 — End: 2014-09-14
  Administered 2014-09-14: 25 mg via ORAL
  Filled 2014-09-14: qty 1

## 2014-09-14 MED ORDER — KETOROLAC TROMETHAMINE 30 MG/ML IJ SOLN
30.0000 mg | Freq: Once | INTRAMUSCULAR | Status: AC
  Administered 2014-09-14: 30 mg via INTRAVENOUS
  Filled 2014-09-14: qty 1

## 2014-09-14 MED ORDER — METHOCARBAMOL 500 MG PO TABS
1000.0000 mg | ORAL_TABLET | Freq: Once | ORAL | Status: AC
  Administered 2014-09-14: 1000 mg via ORAL
  Filled 2014-09-14: qty 2

## 2014-09-14 NOTE — ED Notes (Signed)
Pt arrived via POV c/o flank pain.  Pt states previous CT scan showed 8 kidney stones, pt feels like he has passed 2 of them and 1 is now stuck.  Pt's antibiotics ran out on Saturday.

## 2014-09-14 NOTE — ED Notes (Signed)
Pt stable, ambulatory, states understanding of discharge instructions 
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
## 2014-09-14 NOTE — ED Notes (Signed)
Pt arrived to the ED with a complaint of left sided flank pain.  Pt states the pain has been going on for days.  Pt seen previously at [facility] and was told he had eight kidney stones.  Pt was given medication but was unable to see urologist.  Pt states he feels as if he passed a kidney stone two days ago.

## 2014-09-21 ENCOUNTER — Encounter (HOSPITAL_COMMUNITY): Payer: Self-pay | Admitting: Emergency Medicine

## 2014-09-21 ENCOUNTER — Emergency Department (HOSPITAL_COMMUNITY)
Admission: EM | Admit: 2014-09-21 | Discharge: 2014-09-21 | Disposition: A | Discharge status: Home / Self Care | Payer: Self-pay | Attending: Physician Assistant | Admitting: Physician Assistant

## 2014-09-21 ENCOUNTER — Emergency Department (HOSPITAL_COMMUNITY): Payer: Self-pay

## 2014-09-21 DIAGNOSIS — R197 Diarrhea, unspecified: Secondary | ICD-10-CM | POA: Insufficient documentation

## 2014-09-21 DIAGNOSIS — R110 Nausea: Secondary | ICD-10-CM | POA: Insufficient documentation

## 2014-09-21 DIAGNOSIS — F329 Major depressive disorder, single episode, unspecified: Secondary | ICD-10-CM | POA: Insufficient documentation

## 2014-09-21 DIAGNOSIS — Z79899 Other long term (current) drug therapy: Secondary | ICD-10-CM | POA: Insufficient documentation

## 2014-09-21 DIAGNOSIS — Z87448 Personal history of other diseases of urinary system: Secondary | ICD-10-CM | POA: Insufficient documentation

## 2014-09-21 DIAGNOSIS — Z720 Tobacco use: Secondary | ICD-10-CM | POA: Insufficient documentation

## 2014-09-21 DIAGNOSIS — G40909 Epilepsy, unspecified, not intractable, without status epilepticus: Secondary | ICD-10-CM | POA: Insufficient documentation

## 2014-09-21 DIAGNOSIS — F419 Anxiety disorder, unspecified: Secondary | ICD-10-CM | POA: Insufficient documentation

## 2014-09-21 DIAGNOSIS — Z8619 Personal history of other infectious and parasitic diseases: Secondary | ICD-10-CM | POA: Insufficient documentation

## 2014-09-21 LAB — CBC WITH DIFFERENTIAL/PLATELET
BASOS ABS: 0 10*3/uL (ref 0.0–0.1)
BASOS PCT: 0 % (ref 0–1)
EOS ABS: 0 10*3/uL (ref 0.0–0.7)
EOS PCT: 0 % (ref 0–5)
HCT: 49 % (ref 39.0–52.0)
HEMOGLOBIN: 16.8 g/dL (ref 13.0–17.0)
LYMPHS ABS: 2.7 10*3/uL (ref 0.7–4.0)
Lymphocytes Relative: 13 % (ref 12–46)
MCH: 30.1 pg (ref 26.0–34.0)
MCHC: 34.3 g/dL (ref 30.0–36.0)
MCV: 87.7 fL (ref 78.0–100.0)
Monocytes Absolute: 2.7 10*3/uL — ABNORMAL HIGH (ref 0.1–1.0)
Monocytes Relative: 13 % — ABNORMAL HIGH (ref 3–12)
Neutro Abs: 15.7 10*3/uL — ABNORMAL HIGH (ref 1.7–7.7)
Neutrophils Relative %: 74 % (ref 43–77)
PLATELETS: 290 10*3/uL (ref 150–400)
RBC: 5.59 MIL/uL (ref 4.22–5.81)
RDW: 13.5 % (ref 11.5–15.5)
WBC: 21 10*3/uL — AB (ref 4.0–10.5)

## 2014-09-21 LAB — COMPREHENSIVE METABOLIC PANEL
ALBUMIN: 5.1 g/dL — AB (ref 3.5–5.0)
ALK PHOS: 80 U/L (ref 38–126)
ALT: 45 U/L (ref 17–63)
AST: 37 U/L (ref 15–41)
Anion gap: 10 (ref 5–15)
BUN: 21 mg/dL — AB (ref 6–20)
CALCIUM: 11.6 mg/dL — AB (ref 8.9–10.3)
CHLORIDE: 94 mmol/L — AB (ref 101–111)
CO2: 32 mmol/L (ref 22–32)
CREATININE: 1.15 mg/dL (ref 0.61–1.24)
GFR calc non Af Amer: >60 mL/min (ref >60)
GLUCOSE: 127 mg/dL — AB (ref 65–99)
Potassium: 3.3 mmol/L — ABNORMAL LOW (ref 3.5–5.1)
Sodium: 136 mmol/L (ref 135–145)
Total Bilirubin: 1.3 mg/dL — ABNORMAL HIGH (ref 0.3–1.2)
Total Protein: 9.4 g/dL — ABNORMAL HIGH (ref 6.5–8.1)

## 2014-09-21 LAB — LIPASE, BLOOD: Lipase: 20 U/L — ABNORMAL LOW (ref 22–51)

## 2014-09-21 MED ORDER — HYDROMORPHONE HCL 1 MG/ML IJ SOLN
1.0000 mg | Freq: Once | INTRAMUSCULAR | Status: AC
  Administered 2014-09-21: 1 mg via INTRAVENOUS
  Filled 2014-09-21: qty 1

## 2014-09-21 MED ORDER — SODIUM CHLORIDE 0.9 % IV SOLN
INTRAVENOUS | Status: DC
  Administered 2014-09-21: 16:00:00 via INTRAVENOUS

## 2014-09-21 MED ORDER — GI COCKTAIL ~~LOC~~
30.0000 mL | Freq: Once | ORAL | Status: AC
  Administered 2014-09-21: 30 mL via ORAL
  Filled 2014-09-21: qty 30

## 2014-09-21 MED ORDER — LORAZEPAM 2 MG/ML IJ SOLN
1.0000 mg | Freq: Once | INTRAMUSCULAR | Status: AC
  Administered 2014-09-21: 1 mg via INTRAVENOUS
  Filled 2014-09-21: qty 1

## 2014-09-21 MED ORDER — SODIUM CHLORIDE 0.9 % IV BOLUS (SEPSIS)
2000.0000 mL | Freq: Once | INTRAVENOUS | Status: AC
  Administered 2014-09-21: 2000 mL via INTRAVENOUS

## 2014-09-21 MED ORDER — ONDANSETRON HCL 4 MG PO TABS: 4.0000 mg | ORAL_TABLET | Freq: Three times a day (TID) | ORAL | Status: DC | PRN

## 2014-09-21 MED ORDER — IOHEXOL 300 MG/ML  SOLN
50.0000 mL | Freq: Once | INTRAMUSCULAR | Status: AC | PRN
  Administered 2014-09-21: 50 mL via ORAL

## 2014-09-21 MED ORDER — IOHEXOL 300 MG/ML  SOLN
100.0000 mL | Freq: Once | INTRAMUSCULAR | Status: AC | PRN
  Administered 2014-09-21: 100 mL via INTRAVENOUS

## 2014-09-21 NOTE — ED Notes (Signed)
Patient transported to CT 

## 2014-09-21 NOTE — ED Notes (Signed)
Pt is a&ox4, ambulatory. Questions, concern denied r/t dc

## 2014-09-21 NOTE — ED Notes (Addendum)
MD at bedside. 

## 2014-09-21 NOTE — ED Notes (Signed)
Pt reports multiple occurences of emesis in the past 24 hours-starting at 0500 yesterday morning. Has been unable to keep any PO fluids/food down. Has kidney stones diagnosed last visit. Having bilateral lower flank pain and LLQ abdominal pain. Endorses diarrhea and says he has seen some blood in his vomit. Denies fevers. Appears fatigued. No other c/c.

## 2014-09-21 NOTE — ED Notes (Signed)
Pt is sitting up on the bed with family at the bedside.  Pain remains at 7/10.  Half of oral contrast has been consumed so far. RN provided warm blankets per pt request.

## 2014-09-21 NOTE — Discharge Instructions (Signed)
Please reutrn with any fever or change in symptoms.   Nausea and Vomiting Nausea is a sick feeling that often comes before throwing up (vomiting). Vomiting is a reflex where stomach contents come out of your mouth. Vomiting can cause severe loss of body fluids (dehydration). Children and elderly adults can become dehydrated quickly, especially if they also have diarrhea. Nausea and vomiting are symptoms of a condition or disease. It is important to find the cause of your symptoms. CAUSES   Direct irritation of the stomach lining. This irritation can result from increased acid production (gastroesophageal reflux disease), infection, food poisoning, taking certain medicines (such as nonsteroidal anti-inflammatory drugs), alcohol use, or tobacco use.  Signals from the brain.These signals could be caused by a headache, heat exposure, an inner ear disturbance, increased pressure in the brain from injury, infection, a tumor, or a concussion, pain, emotional stimulus, or metabolic problems.  An obstruction in the gastrointestinal tract (bowel obstruction).  Illnesses such as diabetes, hepatitis, gallbladder problems, appendicitis, kidney problems, cancer, sepsis, atypical symptoms of a heart attack, or eating disorders.  Medical treatments such as chemotherapy and radiation.  Receiving medicine that makes you sleep (general anesthetic) during surgery. DIAGNOSIS Your caregiver may ask for tests to be done if the problems do not improve after a few days. Tests may also be done if symptoms are severe or if the reason for the nausea and vomiting is not clear. Tests may include:  Urine tests.  Blood tests.  Stool tests.  Cultures (to look for evidence of infection).  X-rays or other imaging studies. Test results can help your caregiver make decisions about treatment or the need for additional tests. TREATMENT You need to stay well hydrated. Drink frequently but in small amounts.You may wish to  drink water, sports drinks, clear broth, or eat frozen ice pops or gelatin dessert to help stay hydrated.When you eat, eating slowly may help prevent nausea.There are also some antinausea medicines that may help prevent nausea. HOME CARE INSTRUCTIONS   Take all medicine as directed by your caregiver.  If you do not have an appetite, do not force yourself to eat. However, you must continue to drink fluids.  If you have an appetite, eat a normal diet unless your caregiver tells you differently.  Eat a variety of complex carbohydrates (rice, wheat, potatoes, bread), lean meats, yogurt, fruits, and vegetables.  Avoid high-fat foods because they are more difficult to digest.  Drink enough water and fluids to keep your urine clear or pale yellow.  If you are dehydrated, ask your caregiver for specific rehydration instructions. Signs of dehydration may include:  Severe thirst.  Dry lips and mouth.  Dizziness.  Dark urine.  Decreasing urine frequency and amount.  Confusion.  Rapid breathing or pulse. SEEK IMMEDIATE MEDICAL CARE IF:   You have blood or brown flecks (like coffee grounds) in your vomit.  You have black or bloody stools.  You have a severe headache or stiff neck.  You are confused.  You have severe abdominal pain.  You have chest pain or trouble breathing.  You do not urinate at least once every 8 hours.  You develop cold or clammy skin.  You continue to vomit for longer than 24 to 48 hours.  You have a fever. MAKE SURE YOU:   Understand these instructions.  Will watch your condition.  Will get help right away if you are not doing well or get worse. Document Released: 01/15/2005 Document Revised: 04/09/2011 Document Reviewed: 06/14/2010  Exit[employer] Patient Information 2015 [employer]. This information is not intended to replace advice given to you by your health care provider. Make sure you discuss any questions you have with your health care  provider.
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
## 2014-10-12 ENCOUNTER — Emergency Department (HOSPITAL_COMMUNITY): Payer: No Typology Code available for payment source

## 2014-10-12 ENCOUNTER — Encounter (HOSPITAL_COMMUNITY): Payer: Self-pay | Admitting: Emergency Medicine

## 2014-10-12 ENCOUNTER — Emergency Department (HOSPITAL_COMMUNITY)
Admission: EM | Admit: 2014-10-12 | Discharge: 2014-10-12 | Disposition: A | Discharge status: Home / Self Care | Payer: No Typology Code available for payment source | Attending: Emergency Medicine | Admitting: Emergency Medicine

## 2014-10-12 DIAGNOSIS — G893 Neoplasm related pain (acute) (chronic): Secondary | ICD-10-CM | POA: Diagnosis not present

## 2014-10-12 DIAGNOSIS — F329 Major depressive disorder, single episode, unspecified: Secondary | ICD-10-CM | POA: Diagnosis not present

## 2014-10-12 DIAGNOSIS — Y9389 Activity, other specified: Secondary | ICD-10-CM | POA: Diagnosis not present

## 2014-10-12 DIAGNOSIS — Y92410 Unspecified street and highway as the place of occurrence of the external cause: Secondary | ICD-10-CM | POA: Diagnosis not present

## 2014-10-12 DIAGNOSIS — Y999 Unspecified external cause status: Secondary | ICD-10-CM | POA: Insufficient documentation

## 2014-10-12 DIAGNOSIS — S30811A Abrasion of abdominal wall, initial encounter: Secondary | ICD-10-CM | POA: Diagnosis not present

## 2014-10-12 DIAGNOSIS — S3991XA Unspecified injury of abdomen, initial encounter: Secondary | ICD-10-CM | POA: Diagnosis present

## 2014-10-12 DIAGNOSIS — G40909 Epilepsy, unspecified, not intractable, without status epilepticus: Secondary | ICD-10-CM | POA: Diagnosis not present

## 2014-10-12 DIAGNOSIS — S299XXA Unspecified injury of thorax, initial encounter: Secondary | ICD-10-CM | POA: Diagnosis not present

## 2014-10-12 DIAGNOSIS — S300XXA Contusion of lower back and pelvis, initial encounter: Secondary | ICD-10-CM | POA: Insufficient documentation

## 2014-10-12 DIAGNOSIS — Z8619 Personal history of other infectious and parasitic diseases: Secondary | ICD-10-CM | POA: Diagnosis not present

## 2014-10-12 DIAGNOSIS — W2210XA Striking against or struck by unspecified automobile airbag, initial encounter: Secondary | ICD-10-CM

## 2014-10-12 DIAGNOSIS — Z87438 Personal history of other diseases of male genital organs: Secondary | ICD-10-CM | POA: Diagnosis not present

## 2014-10-12 DIAGNOSIS — S60222A Contusion of left hand, initial encounter: Secondary | ICD-10-CM | POA: Insufficient documentation

## 2014-10-12 DIAGNOSIS — S1093XA Contusion of unspecified part of neck, initial encounter: Secondary | ICD-10-CM | POA: Insufficient documentation

## 2014-10-12 DIAGNOSIS — S40011A Contusion of right shoulder, initial encounter: Secondary | ICD-10-CM

## 2014-10-12 DIAGNOSIS — Z720 Tobacco use: Secondary | ICD-10-CM | POA: Diagnosis not present

## 2014-10-12 DIAGNOSIS — S301XXA Contusion of abdominal wall, initial encounter: Secondary | ICD-10-CM | POA: Diagnosis not present

## 2014-10-12 DIAGNOSIS — S5011XA Contusion of right forearm, initial encounter: Secondary | ICD-10-CM | POA: Insufficient documentation

## 2014-10-12 DIAGNOSIS — F419 Anxiety disorder, unspecified: Secondary | ICD-10-CM | POA: Diagnosis not present

## 2014-10-12 LAB — CBC
HCT: 41.7 % (ref 39.0–52.0)
Hemoglobin: 14.2 g/dL (ref 13.0–17.0)
MCH: 29.9 pg (ref 26.0–34.0)
MCHC: 34.1 g/dL (ref 30.0–36.0)
MCV: 87.8 fL (ref 78.0–100.0)
Platelets: 172 10*3/uL (ref 150–400)
RBC: 4.75 MIL/uL (ref 4.22–5.81)
RDW: 13.4 % (ref 11.5–15.5)
WBC: 11.4 10*3/uL — ABNORMAL HIGH (ref 4.0–10.5)

## 2014-10-12 LAB — URINALYSIS, ROUTINE W REFLEX MICROSCOPIC
GLUCOSE, UA: NEGATIVE mg/dL
Ketones, ur: 15 mg/dL — AB
LEUKOCYTES UA: NEGATIVE
Nitrite: NEGATIVE
PH: 6.5 (ref 5.0–8.0)
PROTEIN: 30 mg/dL — AB
Specific Gravity, Urine: 1.025 (ref 1.005–1.030)
Urobilinogen, UA: 4 mg/dL — ABNORMAL HIGH (ref 0.0–1.0)

## 2014-10-12 LAB — CDS SEROLOGY

## 2014-10-12 LAB — COMPREHENSIVE METABOLIC PANEL
ALBUMIN: 3.5 g/dL (ref 3.5–5.0)
ALK PHOS: 67 U/L (ref 38–126)
ALT: 28 U/L (ref 17–63)
AST: 32 U/L (ref 15–41)
Anion gap: 11 (ref 5–15)
BILIRUBIN TOTAL: 1.7 mg/dL — AB (ref 0.3–1.2)
BUN: 8 mg/dL (ref 6–20)
CALCIUM: 8.4 mg/dL — AB (ref 8.9–10.3)
CO2: 25 mmol/L (ref 22–32)
CREATININE: 0.92 mg/dL (ref 0.61–1.24)
Chloride: 96 mmol/L — ABNORMAL LOW (ref 101–111)
GFR calc Af Amer: >60 mL/min (ref >60)
GFR calc non Af Amer: >60 mL/min (ref >60)
GLUCOSE: 101 mg/dL — AB (ref 65–99)
Potassium: 3.4 mmol/L — ABNORMAL LOW (ref 3.5–5.1)
Sodium: 132 mmol/L — ABNORMAL LOW (ref 135–145)
TOTAL PROTEIN: 6.9 g/dL (ref 6.5–8.1)

## 2014-10-12 LAB — PROTIME-INR
INR: 1.11 (ref 0.00–1.49)
PROTHROMBIN TIME: 14.5 s (ref 11.6–15.2)

## 2014-10-12 LAB — URINE MICROSCOPIC-ADD ON

## 2014-10-12 LAB — SAMPLE TO BLOOD BANK

## 2014-10-12 LAB — ETHANOL

## 2014-10-12 MED ORDER — MORPHINE SULFATE (PF) 4 MG/ML IV SOLN
4.0000 mg | Freq: Once | INTRAVENOUS | Status: AC
  Administered 2014-10-12: 4 mg via INTRAVENOUS
  Filled 2014-10-12: qty 1

## 2014-10-12 MED ORDER — HYDROCODONE-ACETAMINOPHEN 5-325 MG PO TABS: 1.0000 | ORAL_TABLET | ORAL | Status: AC | PRN

## 2014-10-12 MED ORDER — SODIUM CHLORIDE 0.9 % IV BOLUS (SEPSIS)
125.0000 mL | Freq: Once | INTRAVENOUS | Status: AC
  Administered 2014-10-12: 125 mL via INTRAVENOUS

## 2014-10-12 MED ORDER — IOHEXOL 350 MG/ML SOLN
100.0000 mL | Freq: Once | INTRAVENOUS | Status: AC | PRN
  Administered 2014-10-12: 100 mL via INTRAVENOUS

## 2014-10-12 MED ORDER — IBUPROFEN 800 MG PO TABS: 800.0000 mg | ORAL_TABLET | Freq: Three times a day (TID) | ORAL | Status: AC

## 2014-10-12 MED ORDER — ORPHENADRINE CITRATE ER 100 MG PO TB12: 100.0000 mg | ORAL_TABLET | Freq: Two times a day (BID) | ORAL | Status: AC
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
## 2014-10-12 NOTE — ED Notes (Signed)
MD notified patient request for pain medication. No recommendations at this time. States she will placed orders.

## 2014-10-12 NOTE — ED Notes (Signed)
MD at bedside. 
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
## 2014-11-14 ENCOUNTER — Emergency Department (HOSPITAL_COMMUNITY)
Admission: EM | Admit: 2014-11-14 | Discharge: 2014-11-15 | Disposition: A | Discharge status: Home / Self Care | Payer: Self-pay | Attending: Emergency Medicine | Admitting: Emergency Medicine

## 2014-11-14 ENCOUNTER — Encounter (HOSPITAL_COMMUNITY): Payer: Self-pay | Admitting: Emergency Medicine

## 2014-11-14 DIAGNOSIS — F1123 Opioid dependence with withdrawal: Secondary | ICD-10-CM | POA: Insufficient documentation

## 2014-11-14 DIAGNOSIS — Z8619 Personal history of other infectious and parasitic diseases: Secondary | ICD-10-CM | POA: Insufficient documentation

## 2014-11-14 DIAGNOSIS — Z720 Tobacco use: Secondary | ICD-10-CM | POA: Insufficient documentation

## 2014-11-14 DIAGNOSIS — Z791 Long term (current) use of non-steroidal anti-inflammatories (NSAID): Secondary | ICD-10-CM | POA: Insufficient documentation

## 2014-11-14 DIAGNOSIS — Z87448 Personal history of other diseases of urinary system: Secondary | ICD-10-CM | POA: Insufficient documentation

## 2014-11-14 DIAGNOSIS — F13120 Sedative, hypnotic or anxiolytic abuse with intoxication, uncomplicated: Secondary | ICD-10-CM | POA: Insufficient documentation

## 2014-11-14 DIAGNOSIS — R1084 Generalized abdominal pain: Secondary | ICD-10-CM

## 2014-11-14 DIAGNOSIS — F419 Anxiety disorder, unspecified: Secondary | ICD-10-CM | POA: Insufficient documentation

## 2014-11-14 DIAGNOSIS — F1210 Cannabis abuse, uncomplicated: Secondary | ICD-10-CM | POA: Insufficient documentation

## 2014-11-14 DIAGNOSIS — F329 Major depressive disorder, single episode, unspecified: Secondary | ICD-10-CM | POA: Insufficient documentation

## 2014-11-14 DIAGNOSIS — Z79899 Other long term (current) drug therapy: Secondary | ICD-10-CM | POA: Insufficient documentation

## 2014-11-14 DIAGNOSIS — F1193 Opioid use, unspecified with withdrawal: Secondary | ICD-10-CM

## 2014-11-14 DIAGNOSIS — R1110 Vomiting, unspecified: Secondary | ICD-10-CM

## 2014-11-14 DIAGNOSIS — Z8669 Personal history of other diseases of the nervous system and sense organs: Secondary | ICD-10-CM | POA: Insufficient documentation

## 2014-11-14 DIAGNOSIS — F1910 Other psychoactive substance abuse, uncomplicated: Secondary | ICD-10-CM

## 2014-11-14 LAB — COMPREHENSIVE METABOLIC PANEL
ALK PHOS: 85 U/L (ref 38–126)
ALT: 68 U/L — ABNORMAL HIGH (ref 17–63)
ANION GAP: 13 (ref 5–15)
AST: 55 U/L — ABNORMAL HIGH (ref 15–41)
Albumin: 4.8 g/dL (ref 3.5–5.0)
BILIRUBIN TOTAL: 1.4 mg/dL — AB (ref 0.3–1.2)
BUN: 10 mg/dL (ref 6–20)
CALCIUM: 10.1 mg/dL (ref 8.9–10.3)
CO2: 25 mmol/L (ref 22–32)
Chloride: 98 mmol/L — ABNORMAL LOW (ref 101–111)
Creatinine, Ser: 0.97 mg/dL (ref 0.61–1.24)
GLUCOSE: 174 mg/dL — AB (ref 65–99)
Potassium: 4.3 mmol/L (ref 3.5–5.1)
Sodium: 136 mmol/L (ref 135–145)
TOTAL PROTEIN: 9 g/dL — AB (ref 6.5–8.1)

## 2014-11-14 LAB — RAPID URINE DRUG SCREEN, HOSP PERFORMED
Amphetamines: NOT DETECTED
BARBITURATES: NOT DETECTED
Benzodiazepines: POSITIVE — AB
Cocaine: NOT DETECTED
Opiates: POSITIVE — AB
Tetrahydrocannabinol: POSITIVE — AB

## 2014-11-14 LAB — URINALYSIS, ROUTINE W REFLEX MICROSCOPIC
Glucose, UA: NEGATIVE mg/dL
Ketones, ur: 15 mg/dL — AB
Nitrite: NEGATIVE
PROTEIN: 30 mg/dL — AB
Specific Gravity, Urine: 1.024 (ref 1.005–1.030)
UROBILINOGEN UA: 1 mg/dL (ref 0.0–1.0)
pH: 8 (ref 5.0–8.0)

## 2014-11-14 LAB — CBC
HCT: 47.9 % (ref 39.0–52.0)
HEMOGLOBIN: 16.2 g/dL (ref 13.0–17.0)
MCH: 29.7 pg (ref 26.0–34.0)
MCHC: 33.8 g/dL (ref 30.0–36.0)
MCV: 87.9 fL (ref 78.0–100.0)
Platelets: 331 10*3/uL (ref 150–400)
RBC: 5.45 MIL/uL (ref 4.22–5.81)
RDW: 14 % (ref 11.5–15.5)
WBC: 22.5 10*3/uL — ABNORMAL HIGH (ref 4.0–10.5)

## 2014-11-14 LAB — URINE MICROSCOPIC-ADD ON

## 2014-11-14 LAB — LIPASE, BLOOD: Lipase: 23 U/L (ref 22–51)

## 2014-11-14 MED ORDER — DICYCLOMINE HCL 10 MG/ML IM SOLN
20.0000 mg | Freq: Once | INTRAMUSCULAR | Status: AC
  Administered 2014-11-14: 20 mg via INTRAMUSCULAR
  Filled 2014-11-14: qty 2

## 2014-11-14 MED ORDER — ONDANSETRON HCL 4 MG/2ML IJ SOLN
4.0000 mg | Freq: Once | INTRAMUSCULAR | Status: AC
  Administered 2014-11-14: 4 mg via INTRAVENOUS
  Filled 2014-11-14: qty 2

## 2014-11-14 MED ORDER — SODIUM CHLORIDE 0.9 % IV SOLN: 1000.0000 mL | INTRAVENOUS | Status: DC

## 2014-11-14 MED ORDER — PANTOPRAZOLE SODIUM 40 MG IV SOLR
40.0000 mg | Freq: Once | INTRAVENOUS | Status: AC
  Administered 2014-11-14: 40 mg via INTRAVENOUS
  Filled 2014-11-14: qty 40

## 2014-11-14 MED ORDER — DIPHENHYDRAMINE HCL 50 MG/ML IJ SOLN
25.0000 mg | Freq: Once | INTRAMUSCULAR | Status: AC
  Administered 2014-11-14: 25 mg via INTRAVENOUS
  Filled 2014-11-14: qty 1

## 2014-11-14 MED ORDER — SODIUM CHLORIDE 0.9 % IV BOLUS (SEPSIS)
1000.0000 mL | Freq: Once | INTRAVENOUS | Status: AC
  Administered 2014-11-14: 1000 mL via INTRAVENOUS

## 2014-11-14 MED ORDER — ONDANSETRON HCL 4 MG/2ML IJ SOLN
4.0000 mg | Freq: Once | INTRAMUSCULAR | Status: AC | PRN
  Administered 2014-11-14: 4 mg via INTRAVENOUS
  Filled 2014-11-14 (×2): qty 2

## 2014-11-14 MED ORDER — FAMOTIDINE IN NACL 20-0.9 MG/50ML-% IV SOLN
20.0000 mg | Freq: Once | INTRAVENOUS | Status: AC
  Administered 2014-11-14: 20 mg via INTRAVENOUS
  Filled 2014-11-14: qty 50

## 2014-11-14 MED ORDER — SODIUM CHLORIDE 0.9 % IV SOLN
1000.0000 mL | Freq: Once | INTRAVENOUS | Status: AC
  Administered 2014-11-14: 1000 mL via INTRAVENOUS

## 2014-11-14 MED ORDER — METOCLOPRAMIDE HCL 5 MG/ML IJ SOLN
10.0000 mg | Freq: Once | INTRAMUSCULAR | Status: AC
Start: 2014-11-14 — End: 2014-11-14
  Administered 2014-11-14: 10 mg via INTRAVENOUS
  Filled 2014-11-14: qty 2

## 2014-11-14 NOTE — ED Notes (Addendum)
C/o nausea, vomiting, and generalized abd cramping since 6am.  Denies diarrhea/constipation. Pt actively vomiting on arrival.

## 2014-11-15 MED ORDER — ONDANSETRON 4 MG PO TBDP: 4.0000 mg | ORAL_TABLET | ORAL | Status: AC | PRN

## 2014-11-15 NOTE — Discharge Instructions (Signed)
Abdominal Pain, Adult °Many things can cause abdominal pain. Usually, abdominal pain is not caused by a disease and will improve without treatment. It can often be observed and treated at home. Your health care provider will do a physical exam and possibly order blood tests and X-rays to help determine the seriousness of your pain. However, in many cases, more time must pass before a clear cause of the pain can be found. Before that point, your health care provider may not know if you need more testing or further treatment. °HOME CARE INSTRUCTIONS °Monitor your abdominal pain for any changes. The following actions may help to alleviate any discomfort you are experiencing: °· Only take over-the-counter or prescription medicines as directed by your health care provider. °· Do not take laxatives unless directed to do so by your health care provider. °· Try a clear liquid diet (broth, tea, or water) as directed by your health care provider. Slowly move to a bland diet as tolerated. °SEEK MEDICAL CARE IF: °· You have unexplained abdominal pain. °· You have abdominal pain associated with nausea or diarrhea. °· You have pain when you urinate or have a bowel movement. °· You experience abdominal pain that wakes you in the night. °· You have abdominal pain that is worsened or improved by eating food. °· You have abdominal pain that is worsened with eating fatty foods. °· You have a fever. °SEEK IMMEDIATE MEDICAL CARE IF: °· Your pain does not go away within 2 hours. °· You keep throwing up (vomiting). °· Your pain is felt only in portions of the abdomen, such as the right side or the left lower portion of the abdomen. °· You pass bloody or black tarry stools. °MAKE SURE YOU: °· Understand these instructions. °· Will watch your condition. °· Will get help right away if you are not doing well or get worse. °  °This information is not intended to replace advice given to you by your health care provider. Make sure you discuss  any questions you have with your health care provider. °  °Document Released: 10/25/2004 Document Revised: 10/06/2014 Document Reviewed: 09/24/2012 °Elsevier Interactive Patient Education ©2016 Elsevier Inc. °Nausea and Vomiting °Nausea is a sick feeling that often comes before throwing up (vomiting). Vomiting is a reflex where stomach contents come out of your mouth. Vomiting can cause severe loss of body fluids (dehydration). Children and elderly adults can become dehydrated quickly, especially if they also have diarrhea. Nausea and vomiting are symptoms of a condition or disease. It is important to find the cause of your symptoms. °CAUSES  °· Direct irritation of the stomach lining. This irritation can result from increased acid production (gastroesophageal reflux disease), infection, food poisoning, taking certain medicines (such as nonsteroidal anti-inflammatory drugs), alcohol use, or tobacco use. °· Signals from the brain. These signals could be caused by a headache, heat exposure, an inner ear disturbance, increased pressure in the brain from injury, infection, a tumor, or a concussion, pain, emotional stimulus, or metabolic problems. °· An obstruction in the gastrointestinal tract (bowel obstruction). °· Illnesses such as diabetes, hepatitis, gallbladder problems, appendicitis, kidney problems, cancer, sepsis, atypical symptoms of a heart attack, or eating disorders. °· Medical treatments such as chemotherapy and radiation. °· Receiving medicine that makes you sleep (general anesthetic) during surgery. °DIAGNOSIS °Your caregiver may ask for tests to be done if the problems do not improve after a few days. Tests may also be done if symptoms are severe or if the reason for the nausea   and vomiting is not clear. Tests may include:  Urine tests.  Blood tests.  Stool tests.  Cultures (to look for evidence of infection).  X-rays or other imaging studies. Test results can help your caregiver make  decisions about treatment or the need for additional tests. TREATMENT You need to stay well hydrated. Drink frequently but in small amounts.You may wish to drink water, sports drinks, clear broth, or eat frozen ice pops or gelatin dessert to help stay hydrated.When you eat, eating slowly may help prevent nausea.There are also some antinausea medicines that may help prevent nausea. HOME CARE INSTRUCTIONS   Take all medicine as directed by your caregiver.  If you do not have an appetite, do not force yourself to eat. However, you must continue to drink fluids.  If you have an appetite, eat a normal diet unless your caregiver tells you differently.  Eat a variety of complex carbohydrates (rice, wheat, potatoes, bread), lean meats, yogurt, fruits, and vegetables.  Avoid high-fat foods because they are more difficult to digest.  Drink enough water and fluids to keep your urine clear or pale yellow.  If you are dehydrated, ask your caregiver for specific rehydration instructions. Signs of dehydration may include:  Severe thirst.  Dry lips and mouth.  Dizziness.  Dark urine.  Decreasing urine frequency and amount.  Confusion.  Rapid breathing or pulse. SEEK IMMEDIATE MEDICAL CARE IF:   You have blood or brown flecks (like coffee grounds) in your vomit.  You have black or bloody stools.  You have a severe headache or stiff neck.  You are confused.  You have severe abdominal pain.  You have chest pain or trouble breathing.  You do not urinate at least once every 8 hours.  You develop cold or clammy skin.  You continue to vomit for longer than 24 to 48 hours.  You have a fever. MAKE SURE YOU:   Understand these instructions.  Will watch your condition.  Will get help right away if you are not doing well or get worse.   This information is not intended to replace advice given to you by your health care provider. Make sure you discuss any questions you have with  your health care provider.   Document Released: 01/15/2005 Document Revised: 04/09/2011 Document Reviewed: 06/14/2010 Elsevier Interactive Patient Education 2016 Elsevier Inc. Opioid Withdrawal Opioids are a group of narcotic drugs. They include the street drug heroin. They also include pain medicines, such as morphine, hydrocodone, oxycodone, and fentanyl. Opioid withdrawal is a group of characteristic physical and mental signs and symptoms. It typically occurs if you have been using opioids daily for several weeks or longer and stop using or rapidly decrease use. Opioid withdrawal can also occur if you have used opioids daily for a long time and are given a medicine to block the effect.  SIGNS AND SYMPTOMS Opioid withdrawal includes three or more of the following symptoms:   Depressed, anxious, or irritable mood.  Nausea or vomiting.  Muscle aches or spasms.   Watery eyes.   Runny nose.  Dilated pupils, sweating, or hairs standing on end.  Diarrhea or intestinal cramping.  Yawning.   Fever.  Increased blood pressure.  Fast pulse.  Restlessness or trouble sleeping. These signs and symptoms occur within several hours of stopping or reducing short-acting opioids, such as heroin. They can occur within 3 days of stopping or reducing long-acting opioids, such as methadone. Withdrawal begins within minutes of receiving a drug that blocks the effects  of opioids, such as naltrexone or naloxone. DIAGNOSIS  Opioid use disorder is diagnosed by your health care provider. You will be asked about your symptoms, drug and alcohol use, medical history, and use of medicines. A physical exam may be done. Lab tests may be ordered. Your health care provider may have you see a mental health professional.  TREATMENT  The treatment for opioid withdrawal is usually provided by medical doctors with special training in substance use disorders (addiction specialists). The following medicines may be  included in treatment:  Opioids given in place of the abused opioid. They turn on opioid receptors in the brain and lessen or prevent withdrawal symptoms. They are gradually decreased (opioid substitution and taper).  Non-opioids that can lessen certain opioid withdrawal symptoms. They may be used alone or with opioid substitution and taper. Successful long-term recovery usually requires medicine, counseling, and group support. HOME CARE INSTRUCTIONS   Take medicines only as directed by your health care provider.  Check with your health care provider before starting new medicines.  Keep all follow-up visits as directed by your health care provider. SEEK MEDICAL CARE IF:  You are not able to take your medicines as directed.  Your symptoms get worse.  You relapse. SEEK IMMEDIATE MEDICAL CARE IF:  You have serious thoughts about hurting yourself or others.  You have a seizure.  You lose consciousness.   This information is not intended to replace advice given to you by your health care provider. Make sure you discuss any questions you have with your health care provider.   Document Released: 01/18/2003 Document Revised: 02/05/2014 Document Reviewed: 01/28/2013 Elsevier Interactive Patient Education [employer]2016 Elsevier Inc.
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
## 2014-11-15 NOTE — ED Notes (Signed)
Pt tolerating PO's well. MD aware.

## 2014-12-02 ENCOUNTER — Other Ambulatory Visit: Payer: Self-pay | Admitting: Orthopedic Surgery

## 2014-12-02 DIAGNOSIS — M25511 Pain in right shoulder: Secondary | ICD-10-CM

## 2014-12-16 ENCOUNTER — Other Ambulatory Visit: Payer: Self-pay

## 2014-12-20 ENCOUNTER — Ambulatory Visit
Admission: RE | Admit: 2014-12-20 | Discharge: 2014-12-20 | Disposition: A | Discharge status: Home / Self Care | Payer: Self-pay | Source: Ambulatory Visit | Attending: Orthopedic Surgery | Admitting: Orthopedic Surgery

## 2014-12-20 DIAGNOSIS — M25511 Pain in right shoulder: Secondary | ICD-10-CM

## 2014-12-20 MED ORDER — IOHEXOL 180 MG/ML  SOLN
15.0000 mL | Freq: Once | INTRAMUSCULAR | Status: DC | PRN
  Administered 2014-12-20: 15 mL via INTRA_ARTICULAR

## 2015-01-15 ENCOUNTER — Emergency Department (HOSPITAL_COMMUNITY)
Admission: EM | Admit: 2015-01-15 | Discharge: 2015-01-15 | Disposition: A | Discharge status: Home / Self Care | Payer: Self-pay | Attending: Emergency Medicine | Admitting: Emergency Medicine

## 2015-01-15 ENCOUNTER — Emergency Department (INDEPENDENT_AMBULATORY_CARE_PROVIDER_SITE_OTHER)
Admission: EM | Admit: 2015-01-15 | Discharge: 2015-01-15 | Disposition: A | Discharge status: Nursing Home (Includes ICF) | Payer: Self-pay | Source: Home / Self Care | Attending: Emergency Medicine | Admitting: Emergency Medicine

## 2015-01-15 ENCOUNTER — Emergency Department (INDEPENDENT_AMBULATORY_CARE_PROVIDER_SITE_OTHER): Payer: Self-pay

## 2015-01-15 ENCOUNTER — Encounter (HOSPITAL_COMMUNITY): Payer: Self-pay | Admitting: Emergency Medicine

## 2015-01-15 DIAGNOSIS — L03119 Cellulitis of unspecified part of limb: Secondary | ICD-10-CM

## 2015-01-15 DIAGNOSIS — Z79899 Other long term (current) drug therapy: Secondary | ICD-10-CM | POA: Insufficient documentation

## 2015-01-15 DIAGNOSIS — L03113 Cellulitis of right upper limb: Secondary | ICD-10-CM | POA: Insufficient documentation

## 2015-01-15 DIAGNOSIS — F329 Major depressive disorder, single episode, unspecified: Secondary | ICD-10-CM | POA: Insufficient documentation

## 2015-01-15 DIAGNOSIS — F17210 Nicotine dependence, cigarettes, uncomplicated: Secondary | ICD-10-CM | POA: Insufficient documentation

## 2015-01-15 DIAGNOSIS — Z87448 Personal history of other diseases of urinary system: Secondary | ICD-10-CM | POA: Insufficient documentation

## 2015-01-15 DIAGNOSIS — Z791 Long term (current) use of non-steroidal anti-inflammatories (NSAID): Secondary | ICD-10-CM | POA: Insufficient documentation

## 2015-01-15 DIAGNOSIS — Z8669 Personal history of other diseases of the nervous system and sense organs: Secondary | ICD-10-CM | POA: Insufficient documentation

## 2015-01-15 DIAGNOSIS — Z86018 Personal history of other benign neoplasm: Secondary | ICD-10-CM | POA: Insufficient documentation

## 2015-01-15 DIAGNOSIS — Z8619 Personal history of other infectious and parasitic diseases: Secondary | ICD-10-CM | POA: Insufficient documentation

## 2015-01-15 DIAGNOSIS — R000 Tachycardia, unspecified: Secondary | ICD-10-CM | POA: Insufficient documentation

## 2015-01-15 DIAGNOSIS — F419 Anxiety disorder, unspecified: Secondary | ICD-10-CM | POA: Insufficient documentation

## 2015-01-15 LAB — CBC WITH DIFFERENTIAL/PLATELET
BASOS PCT: 0 %
Basophils Absolute: 0 10*3/uL (ref 0.0–0.1)
EOS ABS: 0.1 10*3/uL (ref 0.0–0.7)
Eosinophils Relative: 1 %
HCT: 39.2 % (ref 39.0–52.0)
HEMOGLOBIN: 12.7 g/dL — AB (ref 13.0–17.0)
LYMPHS ABS: 1.5 10*3/uL (ref 0.7–4.0)
Lymphocytes Relative: 18 %
MCH: 28.5 pg (ref 26.0–34.0)
MCHC: 32.4 g/dL (ref 30.0–36.0)
MCV: 88.1 fL (ref 78.0–100.0)
Monocytes Absolute: 0.7 10*3/uL (ref 0.1–1.0)
Monocytes Relative: 8 %
NEUTROS ABS: 6.2 10*3/uL (ref 1.7–7.7)
NEUTROS PCT: 73 %
Platelets: 259 10*3/uL (ref 150–400)
RBC: 4.45 MIL/uL (ref 4.22–5.81)
RDW: 14.7 % (ref 11.5–15.5)
WBC: 8.5 10*3/uL (ref 4.0–10.5)

## 2015-01-15 LAB — COMPREHENSIVE METABOLIC PANEL
ALT: 49 U/L (ref 17–63)
ANION GAP: 6 (ref 5–15)
AST: 55 U/L — ABNORMAL HIGH (ref 15–41)
Albumin: 3.3 g/dL — ABNORMAL LOW (ref 3.5–5.0)
Alkaline Phosphatase: 72 U/L (ref 38–126)
BUN: 8 mg/dL (ref 6–20)
CALCIUM: 8.9 mg/dL (ref 8.9–10.3)
CHLORIDE: 103 mmol/L (ref 101–111)
CO2: 29 mmol/L (ref 22–32)
CREATININE: 0.92 mg/dL (ref 0.61–1.24)
Glucose, Bld: 99 mg/dL (ref 65–99)
POTASSIUM: 4 mmol/L (ref 3.5–5.1)
SODIUM: 138 mmol/L (ref 135–145)
Total Bilirubin: 0.7 mg/dL (ref 0.3–1.2)
Total Protein: 6.6 g/dL (ref 6.5–8.1)

## 2015-01-15 MED ORDER — CLINDAMYCIN HCL 300 MG PO CAPS: 300.0000 mg | ORAL_CAPSULE | Freq: Four times a day (QID) | ORAL | Status: AC

## 2015-01-15 MED ORDER — CLINDAMYCIN PHOSPHATE 900 MG/50ML IV SOLN
900.0000 mg | Freq: Once | INTRAVENOUS | Status: AC
  Administered 2015-01-15: 900 mg via INTRAVENOUS
  Filled 2015-01-15: qty 50

## 2015-01-15 NOTE — ED Notes (Signed)
Pt went to car to get something out before being tx to ED
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
## 2015-01-15 NOTE — ED Notes (Signed)
Pt st's his right hand started having pain and swelling to right hand onset yesterday.  Pt denies any injury.
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
## 2015-01-15 NOTE — Discharge Instructions (Signed)
Is important for you to take your antibiotics as prescribed. You were diagnosed with cellulitis of your hand, which is a skin infection. It is important for you to return to the ED tomorrow for a wound recheck. Return sooner if you begin to experience fevers, worsening redness and pain, numbness or weakness in your hand.  Cellulitis Cellulitis is an infection of the skin and the tissue beneath it. The infected area is usually red and tender. Cellulitis occurs most often in the arms and lower legs.  CAUSES  Cellulitis is caused by bacteria that enter the skin through cracks or cuts in the skin. The most common types of bacteria that cause cellulitis are staphylococci and streptococci. SIGNS AND SYMPTOMS   Redness and warmth.  Swelling.  Tenderness or pain.  Fever. DIAGNOSIS  Your health care provider can usually determine what is wrong based on a physical exam. Blood tests may also be done. TREATMENT  Treatment usually involves taking an antibiotic medicine. HOME CARE INSTRUCTIONS   Take your antibiotic medicine as directed by your health care provider. Finish the antibiotic even if you start to feel better.  Keep the infected arm or leg elevated to reduce swelling.  Apply a warm cloth to the affected area up to 4 times per day to relieve pain.  Take medicines only as directed by your health care provider.  Keep all follow-up visits as directed by your health care provider. SEEK MEDICAL CARE IF:   You notice red streaks coming from the infected area.  Your red area gets larger or turns dark in color.  Your bone or joint underneath the infected area becomes painful after the skin has healed.  Your infection returns in the same area or another area.  You notice a swollen bump in the infected area.  You develop new symptoms.  You have a fever. SEEK IMMEDIATE MEDICAL CARE IF:   You feel very sleepy.  You develop vomiting or diarrhea.  You have a general ill feeling  (malaise) with muscle aches and pains.   This information is not intended to replace advice given to you by your health care provider. Make sure you discuss any questions you have with your health care provider.   Document Released: 10/25/2004 Document Revised: 10/06/2014 Document Reviewed: 04/02/2011 Elsevier Interactive Patient Education [employer]2016 [employer] Inc.

## 2015-03-22 ENCOUNTER — Other Ambulatory Visit (HOSPITAL_COMMUNITY): Payer: Self-pay | Admitting: Orthopedic Surgery

## 2015-03-22 DIAGNOSIS — M545 Low back pain: Secondary | ICD-10-CM

## 2015-03-23 ENCOUNTER — Ambulatory Visit (HOSPITAL_COMMUNITY)
Admission: RE | Admit: 2015-03-23 | Discharge: 2015-03-23 | Disposition: A | Discharge status: Home / Self Care | Payer: Self-pay | Source: Ambulatory Visit | Attending: Orthopedic Surgery | Admitting: Orthopedic Surgery

## 2015-03-23 DIAGNOSIS — M47896 Other spondylosis, lumbar region: Secondary | ICD-10-CM | POA: Insufficient documentation

## 2015-03-23 DIAGNOSIS — M545 Low back pain: Secondary | ICD-10-CM | POA: Insufficient documentation

## 2015-06-23 ENCOUNTER — Other Ambulatory Visit: Payer: Self-pay | Admitting: Orthopedic Surgery

## 2015-06-23 DIAGNOSIS — M25561 Pain in right knee: Secondary | ICD-10-CM

## 2015-07-03 ENCOUNTER — Ambulatory Visit
Admission: RE | Admit: 2015-07-03 | Discharge: 2015-07-03 | Disposition: A | Discharge status: Home / Self Care | Payer: No Typology Code available for payment source | Source: Ambulatory Visit | Attending: Orthopedic Surgery | Admitting: Orthopedic Surgery

## 2015-07-03 DIAGNOSIS — M25561 Pain in right knee: Secondary | ICD-10-CM

## 2015-07-25 ENCOUNTER — Emergency Department (HOSPITAL_COMMUNITY): Payer: No Typology Code available for payment source

## 2015-07-25 ENCOUNTER — Encounter (HOSPITAL_COMMUNITY): Payer: Self-pay | Admitting: Emergency Medicine

## 2015-07-25 ENCOUNTER — Emergency Department (HOSPITAL_COMMUNITY)
Admission: EM | Admit: 2015-07-25 | Discharge: 2015-07-25 | Disposition: A | Discharge status: Home / Self Care | Payer: No Typology Code available for payment source | Attending: Emergency Medicine | Admitting: Emergency Medicine

## 2015-07-25 DIAGNOSIS — S90412A Abrasion, left great toe, initial encounter: Secondary | ICD-10-CM | POA: Diagnosis not present

## 2015-07-25 DIAGNOSIS — Y999 Unspecified external cause status: Secondary | ICD-10-CM | POA: Insufficient documentation

## 2015-07-25 DIAGNOSIS — S50812A Abrasion of left forearm, initial encounter: Secondary | ICD-10-CM | POA: Diagnosis not present

## 2015-07-25 DIAGNOSIS — R51 Headache: Secondary | ICD-10-CM | POA: Diagnosis not present

## 2015-07-25 DIAGNOSIS — S50311A Abrasion of right elbow, initial encounter: Secondary | ICD-10-CM | POA: Insufficient documentation

## 2015-07-25 DIAGNOSIS — S59912A Unspecified injury of left forearm, initial encounter: Secondary | ICD-10-CM | POA: Diagnosis present

## 2015-07-25 DIAGNOSIS — S80211A Abrasion, right knee, initial encounter: Secondary | ICD-10-CM | POA: Insufficient documentation

## 2015-07-25 DIAGNOSIS — S80212A Abrasion, left knee, initial encounter: Secondary | ICD-10-CM | POA: Insufficient documentation

## 2015-07-25 DIAGNOSIS — R3129 Other microscopic hematuria: Secondary | ICD-10-CM | POA: Insufficient documentation

## 2015-07-25 DIAGNOSIS — Y939 Activity, unspecified: Secondary | ICD-10-CM | POA: Diagnosis not present

## 2015-07-25 DIAGNOSIS — R1012 Left upper quadrant pain: Secondary | ICD-10-CM | POA: Insufficient documentation

## 2015-07-25 DIAGNOSIS — Y92410 Unspecified street and highway as the place of occurrence of the external cause: Secondary | ICD-10-CM | POA: Insufficient documentation

## 2015-07-25 DIAGNOSIS — F17210 Nicotine dependence, cigarettes, uncomplicated: Secondary | ICD-10-CM | POA: Insufficient documentation

## 2015-07-25 DIAGNOSIS — R52 Pain, unspecified: Secondary | ICD-10-CM

## 2015-07-25 LAB — URINALYSIS, ROUTINE W REFLEX MICROSCOPIC
GLUCOSE, UA: NEGATIVE mg/dL
Ketones, ur: 15 mg/dL — AB
Leukocytes, UA: NEGATIVE
NITRITE: NEGATIVE
Protein, ur: NEGATIVE mg/dL
pH: 5.5 (ref 5.0–8.0)

## 2015-07-25 LAB — COMPREHENSIVE METABOLIC PANEL
ALBUMIN: 4 g/dL (ref 3.5–5.0)
ALK PHOS: 66 U/L (ref 38–126)
ALT: 101 U/L — AB (ref 17–63)
AST: 91 U/L — ABNORMAL HIGH (ref 15–41)
Anion gap: 10 (ref 5–15)
BILIRUBIN TOTAL: 2.2 mg/dL — AB (ref 0.3–1.2)
BUN: 13 mg/dL (ref 6–20)
CALCIUM: 9.1 mg/dL (ref 8.9–10.3)
CO2: 23 mmol/L (ref 22–32)
CREATININE: 0.98 mg/dL (ref 0.61–1.24)
Chloride: 105 mmol/L (ref 101–111)
GFR calc non Af Amer: >60 mL/min (ref >60)
GLUCOSE: 94 mg/dL (ref 65–99)
Potassium: 3.5 mmol/L (ref 3.5–5.1)
SODIUM: 138 mmol/L (ref 135–145)
TOTAL PROTEIN: 7.1 g/dL (ref 6.5–8.1)

## 2015-07-25 LAB — I-STAT CHEM 8, ED
BUN: 14 mg/dL (ref 6–20)
CALCIUM ION: 1.03 mmol/L — AB (ref 1.12–1.23)
CHLORIDE: 103 mmol/L (ref 101–111)
Creatinine, Ser: 0.8 mg/dL (ref 0.61–1.24)
GLUCOSE: 88 mg/dL (ref 65–99)
HCT: 41 % (ref 39.0–52.0)
Hemoglobin: 13.9 g/dL (ref 13.0–17.0)
POTASSIUM: 3.6 mmol/L (ref 3.5–5.1)
Sodium: 140 mmol/L (ref 135–145)
TCO2: 25 mmol/L (ref 0–100)

## 2015-07-25 LAB — I-STAT CG4 LACTIC ACID, ED: Lactic Acid, Venous: 1.43 mmol/L (ref 0.5–2.0)

## 2015-07-25 LAB — CBC
HCT: 38.6 % — ABNORMAL LOW (ref 39.0–52.0)
Hemoglobin: 12.7 g/dL — ABNORMAL LOW (ref 13.0–17.0)
MCH: 28.5 pg (ref 26.0–34.0)
MCHC: 32.9 g/dL (ref 30.0–36.0)
MCV: 86.5 fL (ref 78.0–100.0)
PLATELETS: 170 10*3/uL (ref 150–400)
RBC: 4.46 MIL/uL (ref 4.22–5.81)
RDW: 14.6 % (ref 11.5–15.5)
WBC: 8.6 10*3/uL (ref 4.0–10.5)

## 2015-07-25 LAB — SAMPLE TO BLOOD BANK

## 2015-07-25 LAB — URINE MICROSCOPIC-ADD ON

## 2015-07-25 LAB — PROTIME-INR
INR: 1.17 (ref 0.00–1.49)
Prothrombin Time: 15.1 seconds (ref 11.6–15.2)

## 2015-07-25 LAB — CDS SEROLOGY

## 2015-07-25 MED ORDER — MORPHINE SULFATE (PF) 2 MG/ML IV SOLN
2.0000 mg | Freq: Once | INTRAVENOUS | Status: AC
  Administered 2015-07-25: 2 mg via INTRAVENOUS
  Filled 2015-07-25: qty 1

## 2015-07-25 MED ORDER — MORPHINE SULFATE (PF) 4 MG/ML IV SOLN
6.0000 mg | Freq: Once | INTRAVENOUS | Status: AC
  Administered 2015-07-25: 6 mg via INTRAVENOUS
  Filled 2015-07-25: qty 2

## 2015-07-25 MED ORDER — MORPHINE SULFATE (PF) 4 MG/ML IV SOLN
INTRAVENOUS | Status: AC
  Filled 2015-07-25: qty 1

## 2015-07-25 MED ORDER — IOPAMIDOL (ISOVUE-300) INJECTION 61%
INTRAVENOUS | Status: AC
  Administered 2015-07-25: 100 mL
  Filled 2015-07-25: qty 100

## 2015-07-25 MED ORDER — KETOROLAC TROMETHAMINE 30 MG/ML IJ SOLN
30.0000 mg | Freq: Once | INTRAMUSCULAR | Status: AC
  Administered 2015-07-25: 30 mg via INTRAVENOUS
  Filled 2015-07-25: qty 1

## 2015-07-25 MED ORDER — MORPHINE SULFATE (PF) 4 MG/ML IV SOLN
6.0000 mg | Freq: Once | INTRAVENOUS | Status: AC
  Administered 2015-07-25: 6 mg via INTRAVENOUS

## 2015-07-25 MED ORDER — MORPHINE SULFATE (PF) 2 MG/ML IV SOLN
INTRAVENOUS | Status: AC
  Filled 2015-07-25: qty 1
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
## 2015-07-25 NOTE — Progress Notes (Signed)
Orthopedic Tech Progress Note Patient Details:  Lawrence Doppicholas Vollmer Jul 28, 1985 [id number]  Patient ID: Lawrence [name], male   DOB: Jul 28, 1985, 30 y.o.   MRN: [id number]   Saul FordyceJennifer C Rayen Palen 07/25/2015, 1:12 PMLevel 2 Trauma.
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
## 2015-07-25 NOTE — Discharge Instructions (Signed)
Take Tylenol or Advil for pain. Wash your wounds daily with soap and water and place a thin layer of bacitracin ointment over the wounds and cover with a sterile bandage. Her wound should be rechecked in 2 or 3 days by an urgent care Center or you can [facility] to get a primary care physician. Signs of infection include more pain, redness around the wounds, swelling or drainage from the wounds. See a physician if you feel that you may be developing infection Return if concerned for any reason

## 2015-07-26 ENCOUNTER — Encounter (HOSPITAL_COMMUNITY): Payer: Self-pay | Admitting: Emergency Medicine

## 2015-07-26 ENCOUNTER — Ambulatory Visit (HOSPITAL_COMMUNITY)
Admission: EM | Admit: 2015-07-26 | Discharge: 2015-07-26 | Disposition: A | Discharge status: Home / Self Care | Payer: No Typology Code available for payment source | Attending: Family Medicine | Admitting: Family Medicine

## 2015-07-26 DIAGNOSIS — Z4800 Encounter for change or removal of nonsurgical wound dressing: Secondary | ICD-10-CM
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
## 2015-07-26 NOTE — ED Notes (Signed)
Dressing change.

## 2015-07-26 NOTE — ED Notes (Signed)
Here for a f/u and to have dressing changed Also c/o persistent HA and left arm pain Brought back in wheelchair A&O x4... No acute distress.

## 2015-07-26 NOTE — Discharge Instructions (Signed)
Dressing Change A dressing is a material placed over wounds. It keeps the wound clean, dry, and protected from further injury.  BEFORE YOU BEGIN  Get your supplies together. Things you may need include:  Salt solution (saline).  Flexible gauze bandage.  Medicated cream.  Tape.  Gloves.  Belly (abdominal) pads.  Gauze squares.  Plastic bags.  Take pain medicine 30 minutes before the bandage change if you need it.  Take a shower before you do the first bandage change of the day. Put plastic wrap or a bag over the dressing. REMOVING YOUR OLD BANDAGE  Wash your hands with soap and water. Dry your hands with a clean towel.  Put on your gloves.  Remove any tape.  Remove the old bandage as told. If it sticks, put a small amount of warm water on it to loosen the bandage.  Remove any gauze or packing tape in your wound.  Take off your gloves.  Put the gloves, tape, gauze, or any packing tape in a plastic bag. CHANGING YOUR BANDAGE  Open the supplies.  Take the cap off the salt solution.  Open the gauze. Leave the gauze on the inside of the package.  Put on your gloves.  Clean your wound as told by your doctor.  Keep your wound dry if your doctor told you to do so.  Your doctor may tell you to do one or more of the following:  Pick up the gauze. Pour the salt solution over the gauze. Squeeze out the extra salt solution.  Put medicated cream or other medicine on your wound.  Put solution soaked gauze only in your wound, not on the skin around it.  Pack your wound loosely.  Put dry gauze on your wound.  Put belly pads over the dry gauze if your bandages soak through.  Tape the bandage in place so it will not fall off. Do not wrap the tape all the way around your arm or leg.  Wrap the bandage with the flexible gauze bandage as told by your doctor.  Take off your gloves. Put them in the plastic bag with the old bandage. Tie the bag shut and throw it  away.  Keep the bandage clean and dry.  Wash your hands. GET HELP RIGHT AWAY IF:   Your skin around the wound looks red.  Your wound feels more tender or sore.  You see yellowish-white fluid (pus) in the wound.  Your wound smells bad.  You have a fever.  Your skin around the wound has a red rash that itches and burns.  You see black or yellow skin in your wound that was not there before.  You feel sick to your stomach (nauseous), throw up (vomit), and feel very tired.   This information is not intended to replace advice given to you by your health care provider. Make sure you discuss any questions you have with your health care provider.   Document Released: 04/13/2008 Document Revised: 02/05/2014 Document Reviewed: 11/26/2010 Elsevier Interactive Patient Education 2016 [employer]  

## 2015-07-28 ENCOUNTER — Ambulatory Visit: Payer: Self-pay

## 2016-04-03 IMAGING — CT CT RENAL STONE PROTOCOL
2 of 4 series · 16 of 46 positions shown, 18 images · non-contrast
Comparison: CT abdomen and pelvis August 30, 2014

CLINICAL DATA: Ongoing LEFT flank pain, recent diagnosis of 8
kidney stones. Possible passed kidney stone 2 days ago. History of
hepatitis-C.

EXAM:
CT ABDOMEN AND PELVIS WITHOUT CONTRAST
TECHNIQUE: Multidetector CT imaging of the abdomen and pelvis was performed
following the standard protocol without IV contrast.

[Series 2: stone study 5.0 i30f 1 · axial · 0.77mm/px · z∈[-527,-87]mm · 13 of 97 slices shown, 15 images]
[im 5/97  soft-tissue]
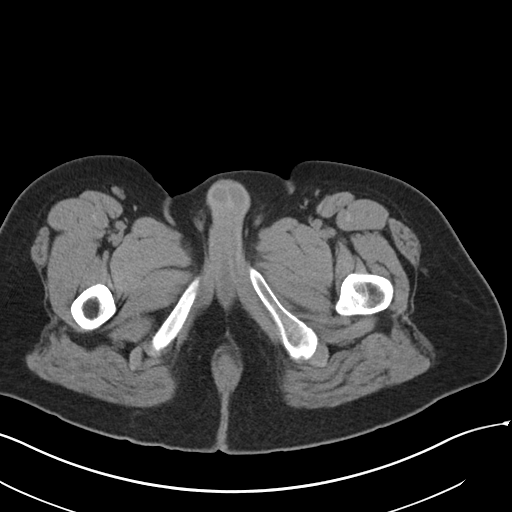
[im 5/97  bone]
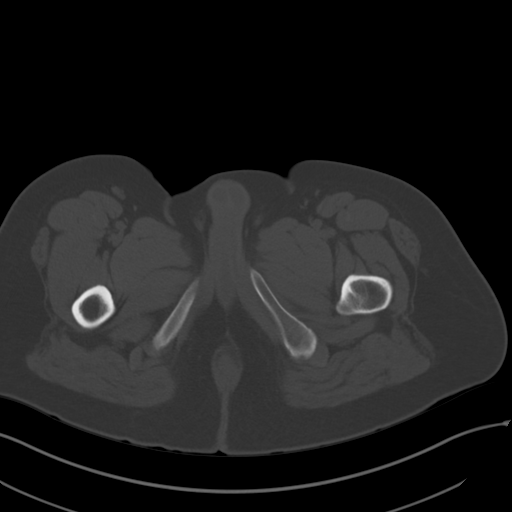
[im 13/97  soft-tissue]
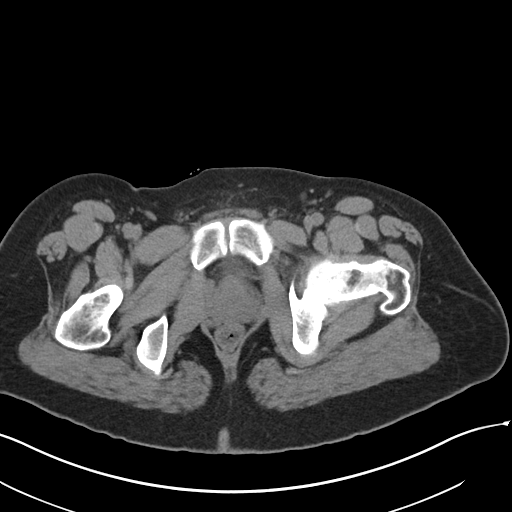
[im 21/97  soft-tissue]
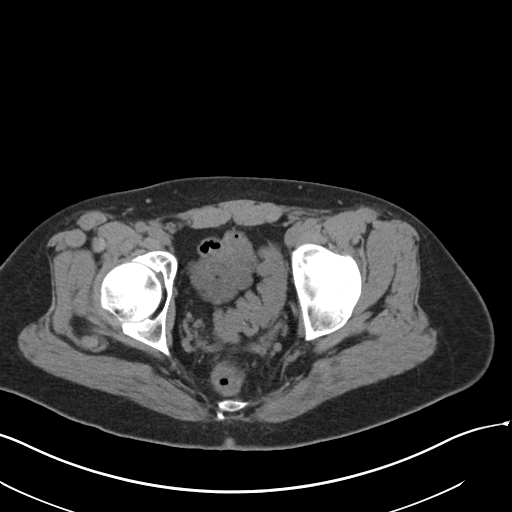
[im 29/97  soft-tissue]
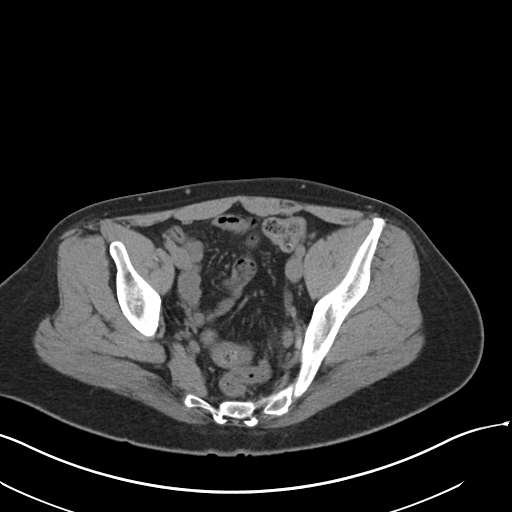
[im 33/97  soft-tissue]
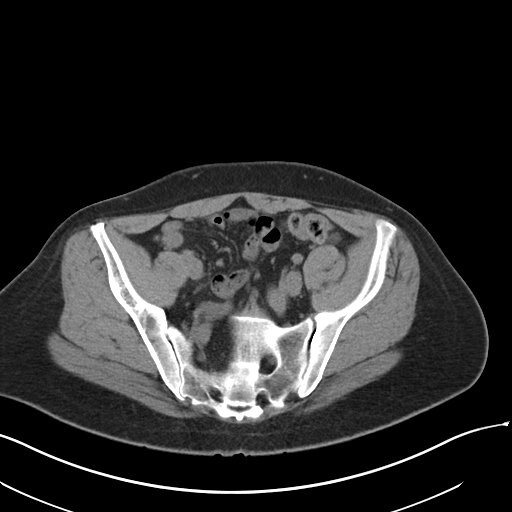
[im 41/97  soft-tissue]
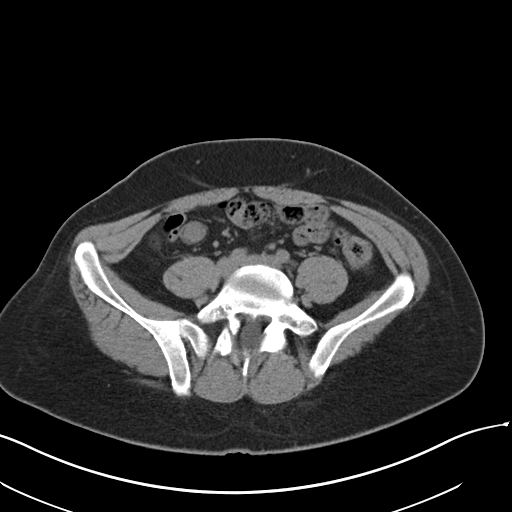
[im 49/97  soft-tissue]
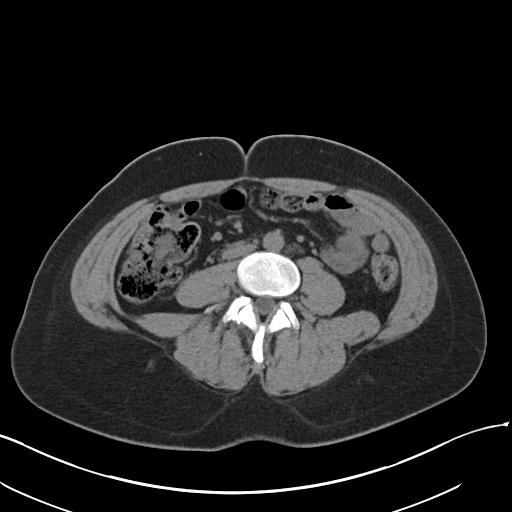
[im 57/97  soft-tissue]
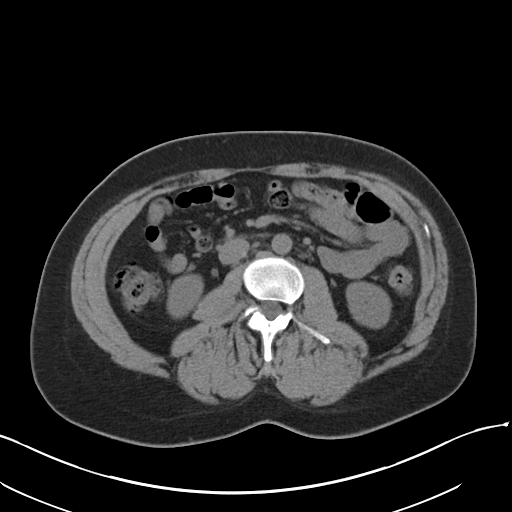
[im 65/97  soft-tissue]
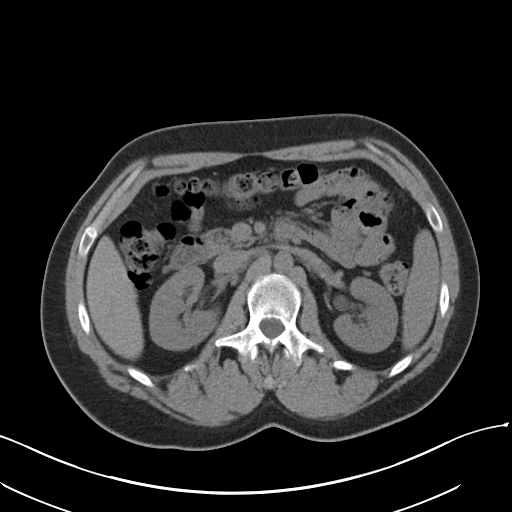
[im 65/97  bone]
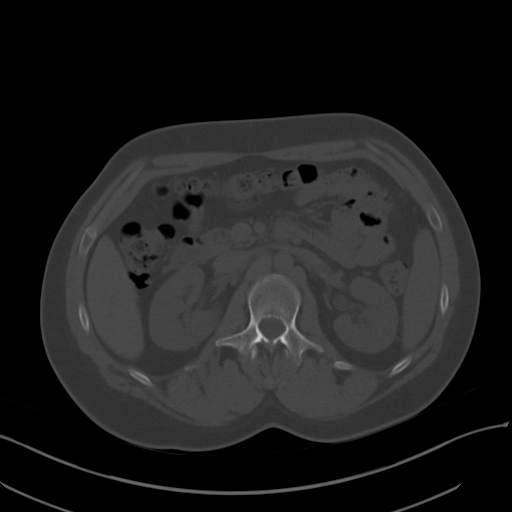
[im 69/97  soft-tissue]
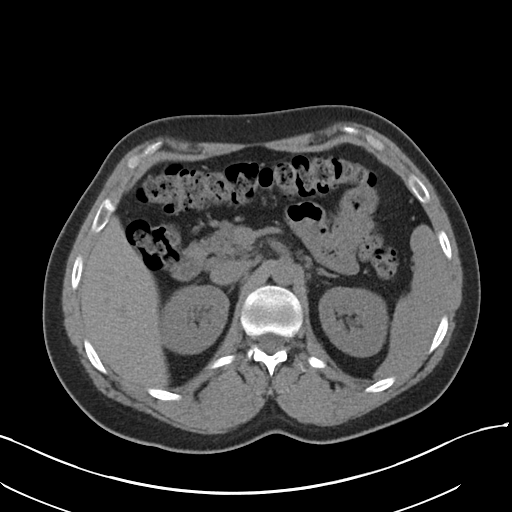
[im 77/97  soft-tissue]
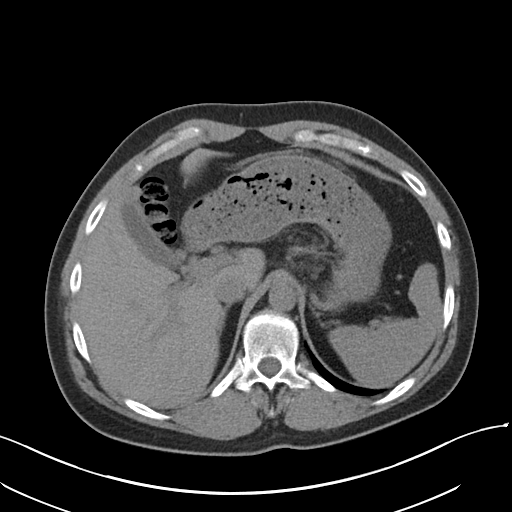
[im 85/97  soft-tissue]
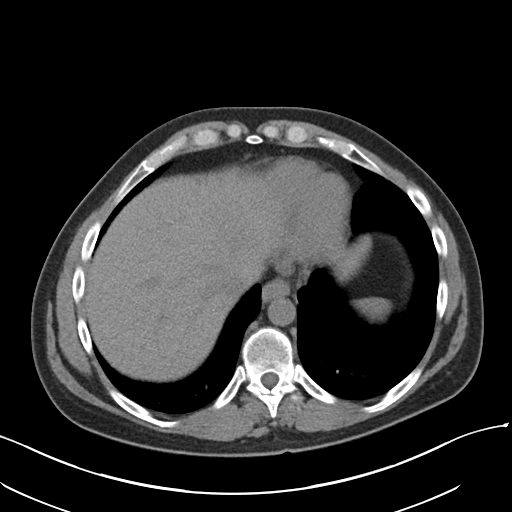
[im 93/97  soft-tissue]
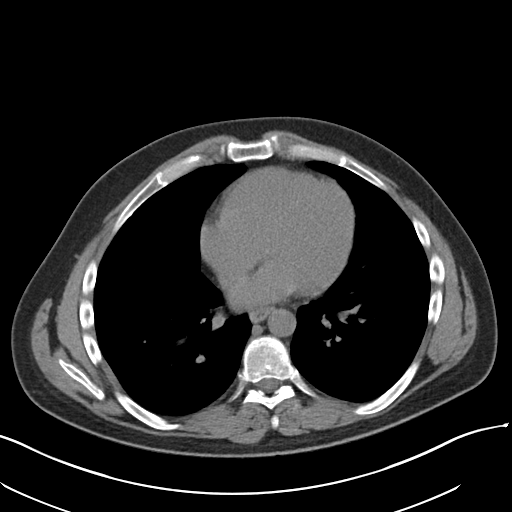

[Series 5: coronal soft tissue · coronal · 0.87mm/px · 3 of 71 slices shown]
[im 24/71  soft-tissue]
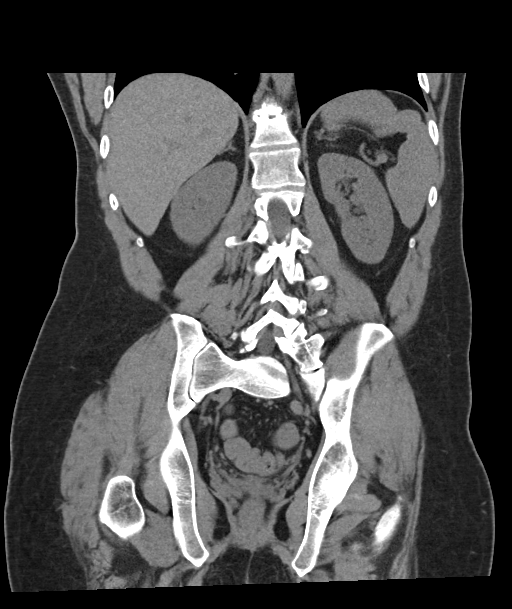
[im 32/71  soft-tissue]
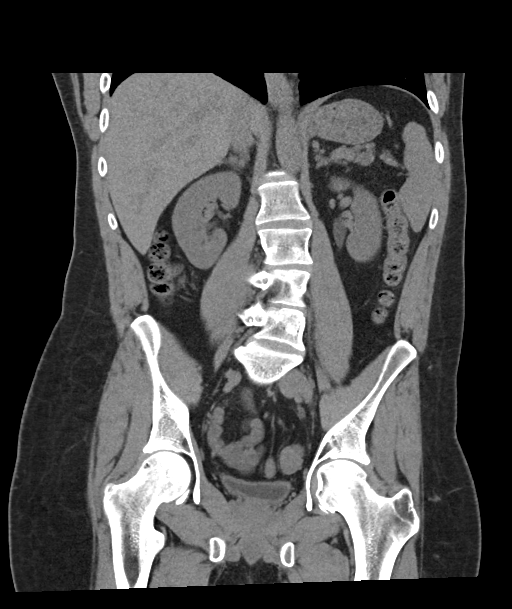
[im 39/71  soft-tissue]
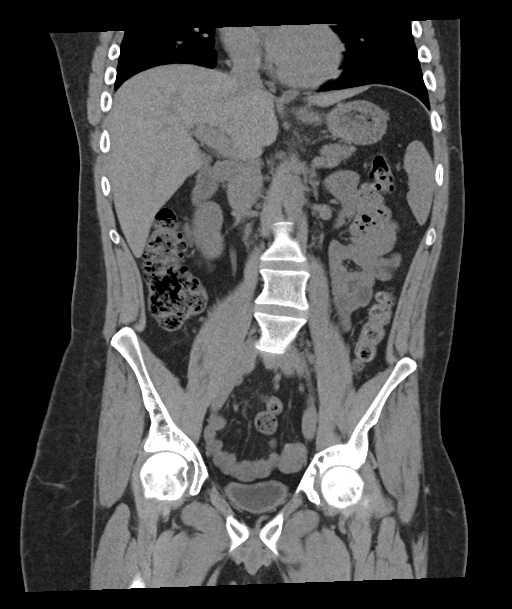

[16 of 46 positions shown; findings below may reference images not displayed]

FINDINGS: LUNG BASES: Included view of the lung bases are clear. Focal areas
of lobular emphysema LEFT lung base. The visualized heart and
pericardium are unremarkable.

KIDNEYS/BLADDER: Kidneys are orthotopic, demonstrating normal size
and morphology. 4 mm RIGHT lower pole, 2 punctate RIGHT interpolar,
4 mm RIGHT interpolar and punctate RIGHT upper pole nephrolithiasis.
Punctate LEFT interpolar and 3 mm LEFT interpolar nephrolithiasis,
punctate LEFT lower pole nephrolithiasis. No hydronephrosis; limited
assessment for renal masses on this nonenhanced examination. The
unopacified ureters are normal in course and caliber. Urinary
bladder is decompressed and unremarkable.

SOLID ORGANS: The liver, spleen, gallbladder, pancreas and adrenal
glands are unremarkable for this non-contrast examination.

GASTROINTESTINAL TRACT: The stomach, small and large bowel are
normal in course and caliber without inflammatory changes, the
sensitivity may be decreased by lack of enteric contrast. Moderate
amount of retained large bowel stool. Normal appendix.

PERITONEUM/RETROPERITONEUM: Aortoiliac vessels are normal in course
and caliber. No lymphadenopathy by CT size criteria. Internal
reproductive organs are unremarkable. No intraperitoneal free fluid
nor free air.

SOFT TISSUES/ OSSEOUS STRUCTURES: Nonsuspicious. Transitional
anatomy, lumbarized S1 vertebral body, chronic RIGHT S1 pars
interarticularis defect without spondylolysis. Broad levoscoliosis.
Severe T11-12 degenerative disc, unchanged . Focal sclerosis RIGHT
femoral head without collapse.
IMPRESSION: Bilateral nonobstructing nephrolithiasis measuring up to 4 mm on the
RIGHT.

No acute intra-abdominal pelvic process.

Focal sclerosis RIGHT femoral head concerning for early AVN without
collapse.

## 2016-05-29 DEATH — deceased
